# Patient Record
Sex: Female | Born: 1935 | Race: Black or African American | Hispanic: No | Smoking: Never smoker
Health system: Southern US, Community
[De-identification: ages and names within clinical notes are randomized; demographics above are authoritative.]

## PROBLEM LIST (undated history)

## (undated) DIAGNOSIS — E119 Type 2 diabetes mellitus without complications: Secondary | ICD-10-CM

## (undated) DIAGNOSIS — G8190 Hemiplegia, unspecified affecting unspecified side: Secondary | ICD-10-CM

## (undated) DIAGNOSIS — F32A Depression, unspecified: Secondary | ICD-10-CM

## (undated) DIAGNOSIS — F329 Major depressive disorder, single episode, unspecified: Secondary | ICD-10-CM

## (undated) HISTORY — DX: Type 2 diabetes mellitus without complications: E11.9

## (undated) HISTORY — DX: Major depressive disorder, single episode, unspecified: F32.9

## (undated) HISTORY — DX: Depression, unspecified: F32.A

## (undated) HISTORY — DX: Hemiplegia, unspecified affecting unspecified side: G81.90

---

## 2003-11-13 ENCOUNTER — Ambulatory Visit (HOSPITAL_COMMUNITY): Admission: RE | Admit: 2003-11-13 | Discharge: 2003-11-13 | Payer: Self-pay

## 2015-08-04 ENCOUNTER — Ambulatory Visit (INDEPENDENT_AMBULATORY_CARE_PROVIDER_SITE_OTHER): Payer: Medicare Other | Admitting: Sports Medicine

## 2015-08-04 ENCOUNTER — Ambulatory Visit: Payer: Self-pay

## 2015-08-04 ENCOUNTER — Encounter: Payer: Self-pay | Admitting: Sports Medicine

## 2015-08-04 VITALS — BP 173/87 | HR 68 | Resp 16

## 2015-08-04 DIAGNOSIS — M722 Plantar fascial fibromatosis: Secondary | ICD-10-CM

## 2015-08-04 DIAGNOSIS — R531 Weakness: Secondary | ICD-10-CM

## 2015-08-04 DIAGNOSIS — B351 Tinea unguium: Secondary | ICD-10-CM | POA: Diagnosis not present

## 2015-08-04 DIAGNOSIS — M79676 Pain in unspecified toe(s): Secondary | ICD-10-CM | POA: Diagnosis not present

## 2015-08-04 NOTE — Progress Notes (Signed)
Patient ID: Sara Schmidt, female   DOB: 11-16-1930, 82 y.o.   MRN: [id number] Subjective: Sara Schmidt is a 82 y.o. female patient seen today in office with complaint of painful thickened and elongated toenails; unable to trim. Patient denies history of known Diabetes, Neuropathy, or Vascular disease. Admits to history of stroke with Left sided weakness. Patient has no other pedal complaints at this time.   Review of Systems  Musculoskeletal: Positive for arthralgias.  All other systems reviewed and are negative.   There are no active problems to display for this patient.   No current outpatient prescriptions on file prior to visit.   No current facility-administered medications on file prior to visit.    Allergies  Allergen Reactions  . Ampicillin     Objective: Physical Exam  General: Well developed, nourished, no acute distress, awake, alert and oriented x 3  Vascular: Dorsalis pedis artery 1/4 bilateral, Posterior tibial artery 0/4 bilateral, skin temperature warm to warm proximal to distal bilateral lower extremities, no varicosities, decreased pedal hair present bilateral.  Neurological: Gross sensation present via light touch bilateral.   Dermatological: Skin is warm, dry, and supple bilateral, Nails 1-10 are tender, long, thick, and discolored with mild subungal debris with spicules at hallux nail, no webspace macerations present bilateral, no open lesions present bilateral, no callus/corns/hyperkeratotic tissue present bilateral. No signs of infection bilateral.  Musculoskeletal: Hammertoe deformities noted bilateral. Muscular strength decreased with left sided weakness without painon range of motion. No pain with calf compression bilateral.  Assessment and Plan:  Problem List Items Addressed This Visit    None    Visit Diagnoses    Pain due to onychomycosis of toenail    -  Primary    Left-sided weakness           -Examined patient.  -Discussed treatment options  for painful mycotic nails. -Mechanically debrided and reduced mycotic nails with sterile nail nipper and dremel nail file without incident. -Patient to return in 3 months for follow up evaluation or sooner if symptoms worsen.  Asencion Islamitorya Eri Platten, DPM

## 2015-08-04 NOTE — Progress Notes (Deleted)
   Subjective:    Patient ID: Sara Schmidt, female    DOB: Aug 26, 1930, 82 y.o.   MRN: [id number]  HPI    Review of Systems  Musculoskeletal: Positive for arthralgias.  All other systems reviewed and are negative.      Objective:   Physical Exam        Assessment & Plan:

## 2015-11-10 ENCOUNTER — Encounter: Payer: Self-pay | Admitting: Sports Medicine

## 2015-11-10 ENCOUNTER — Ambulatory Visit (INDEPENDENT_AMBULATORY_CARE_PROVIDER_SITE_OTHER): Payer: Medicare Other | Admitting: Sports Medicine

## 2015-11-10 DIAGNOSIS — M6289 Other specified disorders of muscle: Secondary | ICD-10-CM

## 2015-11-10 DIAGNOSIS — B351 Tinea unguium: Secondary | ICD-10-CM

## 2015-11-10 DIAGNOSIS — R531 Weakness: Secondary | ICD-10-CM

## 2015-11-10 DIAGNOSIS — M79676 Pain in unspecified toe(s): Secondary | ICD-10-CM | POA: Diagnosis not present

## 2015-11-10 DIAGNOSIS — E1142 Type 2 diabetes mellitus with diabetic polyneuropathy: Secondary | ICD-10-CM

## 2015-11-10 NOTE — Progress Notes (Signed)
Patient ID: Sara Schmidt Shands, female   DOB: May 15, 1930, 82 y.o.   MRN: [id number]  Subjective: Sara Schmidt Marinello is a 82 y.o. Diabetic female patient seen today in office with complaint of painful thickened and elongated toenails; unable to trim. Patient denies any changes since last visit is assisted by living facility care giver. Patient has no other pedal complaints at this time.   FBS not recorded   There are no active problems to display for this patient.   Current Outpatient Prescriptions on File Prior to Visit  Medication Sig Dispense Refill  . acetaminophen (TYLENOL) 650 MG CR tablet Take 650 mg by mouth every 8 (eight) hours as needed for pain.    . Albuterol (VENTOLIN IN) Inhale into the lungs.    Marland Kitchen. amLODipine (NORVASC) 10 MG tablet Take 10 mg by mouth daily.    Marland Kitchen. aspirin 81 MG EC tablet Take 81 mg by mouth daily. Swallow whole.    Marland Kitchen. atorvastatin (LIPITOR) 10 MG tablet Take 10 mg by mouth daily.    . CHOLECALCIFEROL PO Take by mouth.    . cloNIDine (CATAPRES) 0.1 MG tablet Take 0.1 mg by mouth 2 (two) times daily.    Tery Sanfilippo. Docusate Sodium (COLACE PO) Take by mouth.    . DULoxetine (CYMBALTA) 60 MG capsule Take 60 mg by mouth daily.    . Ferrous Sulfate (FEROSUL PO) Take by mouth.    . furosemide (LASIX) 20 MG tablet Take 20 mg by mouth.    . gabapentin (NEURONTIN) 300 MG capsule Take 300 mg by mouth 3 (three) times daily.    . Hypromellose (ARTIFICIAL TEARS OP) Apply to eye.    . insulin glargine (LANTUS) 100 UNIT/ML injection Inject into the skin at bedtime.    Marland Kitchen. lisinopril (PRINIVIL,ZESTRIL) 40 MG tablet Take 40 mg by mouth daily.    . magnesium oxide (MAG-OX) 400 MG tablet Take 400 mg by mouth daily.    . metFORMIN (GLUCOPHAGE) 1000 MG tablet Take 1,000 mg by mouth 2 (two) times daily with a meal.    . metoprolol succinate (TOPROL-XL) 50 MG 24 hr tablet Take 50 mg by mouth daily. Take with or immediately following a meal.    . omeprazole (PRILOSEC) 20 MG capsule Take 20 mg by mouth daily.     . polyethylene glycol powder (MIRALAX) powder Take 1 Container by mouth once.    . pregabalin (LYRICA) 50 MG capsule Take 50 mg by mouth 3 (three) times daily.    Marland Kitchen. senna (SENOKOT) 8.6 MG tablet Take 1 tablet by mouth daily.     No current facility-administered medications on file prior to visit.    Allergies  Allergen Reactions  . Ampicillin     Objective: Physical Exam  General: Well developed, nourished, no acute distress, awake, alert and oriented x 3 in wheelchair  Vascular: Dorsalis pedis artery 1/4 bilateral, Posterior tibial artery 0/4 bilateral, skin temperature warm to warm proximal to distal bilateral lower extremities, no varicosities, decreased pedal hair present bilateral.  Neurological: Gross sensation present via light touch bilateral.   Dermatological: Skin is warm, dry, and supple bilateral, Nails 1-10 are tender, long, thick, and discolored with mild subungal debris with spicules at hallux nail, no webspace macerations present bilateral, no open lesions present bilateral, no callus/corns/hyperkeratotic tissue present bilateral. No signs of infection bilateral.  Musculoskeletal: Hammertoe deformities noted bilateral. Muscular strength decreased with left sided weakness without painon range of motion. No pain with calf compression bilateral.  Assessment and Plan:  Problem List Items Addressed This Visit    None    Visit Diagnoses    Pain due to onychomycosis of toenail    -  Primary    Left-sided weakness        Diabetic peripheral neuropathy associated with type 2 diabetes mellitus (HCC)          -Examined patient.  -Discussed treatment options for painful mycotic nails. -Mechanically debrided and reduced mycotic nails with sterile nail nipper and dremel nail file without incident. -Encouraged daily foot inspection in the setting of diabetes  -Patient to return in 3 months for follow up evaluation or sooner if symptoms worsen.  Asencion Islam, DPM

## 2015-11-17 ENCOUNTER — Emergency Department (HOSPITAL_COMMUNITY): Payer: Medicare Other

## 2015-11-17 ENCOUNTER — Inpatient Hospital Stay (HOSPITAL_COMMUNITY)
Admission: EM | Admit: 2015-11-17 | Discharge: 2015-11-22 | DRG: 190 | Disposition: A | Discharge status: Skilled Nursing Facility | Payer: Medicare Other | Attending: Internal Medicine | Admitting: Internal Medicine

## 2015-11-17 ENCOUNTER — Encounter (HOSPITAL_COMMUNITY): Payer: Self-pay | Admitting: Emergency Medicine

## 2015-11-17 DIAGNOSIS — J440 Chronic obstructive pulmonary disease with acute lower respiratory infection: Secondary | ICD-10-CM | POA: Diagnosis present

## 2015-11-17 DIAGNOSIS — E1140 Type 2 diabetes mellitus with diabetic neuropathy, unspecified: Secondary | ICD-10-CM | POA: Diagnosis present

## 2015-11-17 DIAGNOSIS — I248 Other forms of acute ischemic heart disease: Secondary | ICD-10-CM | POA: Diagnosis present

## 2015-11-17 DIAGNOSIS — G9341 Metabolic encephalopathy: Secondary | ICD-10-CM | POA: Diagnosis present

## 2015-11-17 DIAGNOSIS — Z9989 Dependence on other enabling machines and devices: Secondary | ICD-10-CM

## 2015-11-17 DIAGNOSIS — R0600 Dyspnea, unspecified: Secondary | ICD-10-CM

## 2015-11-17 DIAGNOSIS — R1310 Dysphagia, unspecified: Secondary | ICD-10-CM | POA: Diagnosis present

## 2015-11-17 DIAGNOSIS — Y95 Nosocomial condition: Secondary | ICD-10-CM | POA: Diagnosis present

## 2015-11-17 DIAGNOSIS — J441 Chronic obstructive pulmonary disease with (acute) exacerbation: Secondary | ICD-10-CM | POA: Diagnosis present

## 2015-11-17 DIAGNOSIS — F329 Major depressive disorder, single episode, unspecified: Secondary | ICD-10-CM | POA: Diagnosis present

## 2015-11-17 DIAGNOSIS — I110 Hypertensive heart disease with heart failure: Secondary | ICD-10-CM | POA: Diagnosis present

## 2015-11-17 DIAGNOSIS — Z880 Allergy status to penicillin: Secondary | ICD-10-CM

## 2015-11-17 DIAGNOSIS — E785 Hyperlipidemia, unspecified: Secondary | ICD-10-CM | POA: Diagnosis present

## 2015-11-17 DIAGNOSIS — J9622 Acute and chronic respiratory failure with hypercapnia: Secondary | ICD-10-CM | POA: Diagnosis present

## 2015-11-17 DIAGNOSIS — Z833 Family history of diabetes mellitus: Secondary | ICD-10-CM

## 2015-11-17 DIAGNOSIS — R0902 Hypoxemia: Secondary | ICD-10-CM

## 2015-11-17 DIAGNOSIS — Z6841 Body Mass Index (BMI) 40.0 and over, adult: Secondary | ICD-10-CM

## 2015-11-17 DIAGNOSIS — E662 Morbid (severe) obesity with alveolar hypoventilation: Secondary | ICD-10-CM | POA: Diagnosis present

## 2015-11-17 DIAGNOSIS — G4733 Obstructive sleep apnea (adult) (pediatric): Secondary | ICD-10-CM | POA: Diagnosis not present

## 2015-11-17 DIAGNOSIS — I10 Essential (primary) hypertension: Secondary | ICD-10-CM | POA: Diagnosis not present

## 2015-11-17 DIAGNOSIS — J189 Pneumonia, unspecified organism: Secondary | ICD-10-CM | POA: Diagnosis present

## 2015-11-17 DIAGNOSIS — R32 Unspecified urinary incontinence: Secondary | ICD-10-CM | POA: Diagnosis present

## 2015-11-17 DIAGNOSIS — I471 Supraventricular tachycardia: Secondary | ICD-10-CM | POA: Diagnosis present

## 2015-11-17 DIAGNOSIS — I69354 Hemiplegia and hemiparesis following cerebral infarction affecting left non-dominant side: Secondary | ICD-10-CM | POA: Diagnosis not present

## 2015-11-17 DIAGNOSIS — I4510 Unspecified right bundle-branch block: Secondary | ICD-10-CM | POA: Diagnosis present

## 2015-11-17 DIAGNOSIS — E119 Type 2 diabetes mellitus without complications: Secondary | ICD-10-CM

## 2015-11-17 DIAGNOSIS — R4781 Slurred speech: Secondary | ICD-10-CM | POA: Diagnosis present

## 2015-11-17 DIAGNOSIS — I69398 Other sequelae of cerebral infarction: Secondary | ICD-10-CM | POA: Diagnosis not present

## 2015-11-17 DIAGNOSIS — R0602 Shortness of breath: Secondary | ICD-10-CM | POA: Diagnosis present

## 2015-11-17 DIAGNOSIS — Z794 Long term (current) use of insulin: Secondary | ICD-10-CM | POA: Diagnosis not present

## 2015-11-17 DIAGNOSIS — G8929 Other chronic pain: Secondary | ICD-10-CM | POA: Diagnosis present

## 2015-11-17 DIAGNOSIS — J9621 Acute and chronic respiratory failure with hypoxia: Secondary | ICD-10-CM | POA: Diagnosis not present

## 2015-11-17 DIAGNOSIS — E872 Acidosis: Secondary | ICD-10-CM | POA: Diagnosis present

## 2015-11-17 DIAGNOSIS — J9690 Respiratory failure, unspecified, unspecified whether with hypoxia or hypercapnia: Secondary | ICD-10-CM

## 2015-11-17 DIAGNOSIS — E860 Dehydration: Secondary | ICD-10-CM | POA: Diagnosis present

## 2015-11-17 DIAGNOSIS — I5033 Acute on chronic diastolic (congestive) heart failure: Secondary | ICD-10-CM | POA: Diagnosis present

## 2015-11-17 DIAGNOSIS — J9601 Acute respiratory failure with hypoxia: Secondary | ICD-10-CM | POA: Diagnosis not present

## 2015-11-17 LAB — BASIC METABOLIC PANEL
Anion gap: 8 (ref 5–15)
BUN: 15 mg/dL (ref 6–20)
CALCIUM: 9.2 mg/dL (ref 8.9–10.3)
CO2: 32 mmol/L (ref 22–32)
CREATININE: 0.73 mg/dL (ref 0.44–1.00)
Chloride: 101 mmol/L (ref 101–111)
GFR calc Af Amer: >60 mL/min (ref >60)
GLUCOSE: 146 mg/dL — AB (ref 65–99)
POTASSIUM: 4.2 mmol/L (ref 3.5–5.1)
SODIUM: 141 mmol/L (ref 135–145)

## 2015-11-17 LAB — CBC WITH DIFFERENTIAL/PLATELET
Basophils Absolute: 0 10*3/uL (ref 0.0–0.1)
Basophils Relative: 0 %
EOS ABS: 0.2 10*3/uL (ref 0.0–0.7)
EOS PCT: 2 %
HCT: 40 % (ref 36.0–46.0)
Hemoglobin: 11.7 g/dL — ABNORMAL LOW (ref 12.0–15.0)
LYMPHS ABS: 1.7 10*3/uL (ref 0.7–4.0)
LYMPHS PCT: 23 %
MCH: 26.2 pg (ref 26.0–34.0)
MCHC: 29.3 g/dL — AB (ref 30.0–36.0)
MCV: 89.5 fL (ref 78.0–100.0)
MONO ABS: 0.6 10*3/uL (ref 0.1–1.0)
MONOS PCT: 8 %
Neutro Abs: 5 10*3/uL (ref 1.7–7.7)
Neutrophils Relative %: 67 %
PLATELETS: 220 10*3/uL (ref 150–400)
RBC: 4.47 MIL/uL (ref 3.87–5.11)
RDW: 16.8 % — AB (ref 11.5–15.5)
WBC: 7.4 10*3/uL (ref 4.0–10.5)

## 2015-11-17 LAB — LACTIC ACID, PLASMA
LACTIC ACID, VENOUS: 3.1 mmol/L — AB (ref 0.5–1.9)
Lactic Acid, Venous: 2.3 mmol/L (ref 0.5–1.9)

## 2015-11-17 LAB — BRAIN NATRIURETIC PEPTIDE: B Natriuretic Peptide: 130 pg/mL — ABNORMAL HIGH (ref 0.0–100.0)

## 2015-11-17 MED ORDER — PANTOPRAZOLE SODIUM 40 MG PO TBEC
40.0000 mg | DELAYED_RELEASE_TABLET | Freq: Every day | ORAL | Status: DC
  Administered 2015-11-18 – 2015-11-19 (×2): 40 mg via ORAL
  Filled 2015-11-17 (×2): qty 1

## 2015-11-17 MED ORDER — DULOXETINE HCL 30 MG PO CPEP
60.0000 mg | ORAL_CAPSULE | Freq: Every day | ORAL | Status: DC
  Administered 2015-11-18 – 2015-11-19 (×2): 60 mg via ORAL
  Filled 2015-11-17 (×2): qty 1

## 2015-11-17 MED ORDER — DEXTROSE 5 % IV SOLN
1.0000 g | Freq: Three times a day (TID) | INTRAVENOUS | Status: DC
  Administered 2015-11-18 – 2015-11-21 (×10): 1 g via INTRAVENOUS
  Filled 2015-11-17 (×12): qty 1

## 2015-11-17 MED ORDER — METOPROLOL SUCCINATE ER 25 MG PO TB24
50.0000 mg | ORAL_TABLET | Freq: Every day | ORAL | Status: DC
  Administered 2015-11-18: 50 mg via ORAL
  Filled 2015-11-17: qty 1

## 2015-11-17 MED ORDER — AMLODIPINE BESYLATE 10 MG PO TABS
10.0000 mg | ORAL_TABLET | Freq: Every day | ORAL | Status: DC
  Administered 2015-11-18 – 2015-11-19 (×2): 10 mg via ORAL
  Filled 2015-11-17 (×2): qty 1

## 2015-11-17 MED ORDER — ATORVASTATIN CALCIUM 10 MG PO TABS
10.0000 mg | ORAL_TABLET | Freq: Every day | ORAL | Status: DC
  Administered 2015-11-18 (×2): 10 mg via ORAL
  Filled 2015-11-17 (×2): qty 1

## 2015-11-17 MED ORDER — LISINOPRIL 10 MG PO TABS
40.0000 mg | ORAL_TABLET | Freq: Every day | ORAL | Status: DC
  Administered 2015-11-18 – 2015-11-19 (×2): 40 mg via ORAL
  Filled 2015-11-17 (×2): qty 2

## 2015-11-17 MED ORDER — CLONIDINE HCL 0.1 MG PO TABS
0.1000 mg | ORAL_TABLET | Freq: Two times a day (BID) | ORAL | Status: DC
  Administered 2015-11-18 – 2015-11-19 (×4): 0.1 mg via ORAL
  Filled 2015-11-17 (×4): qty 1

## 2015-11-17 MED ORDER — ONDANSETRON HCL 4 MG PO TABS: 4.0000 mg | ORAL_TABLET | Freq: Four times a day (QID) | ORAL | Status: DC | PRN

## 2015-11-17 MED ORDER — INSULIN ASPART 100 UNIT/ML ~~LOC~~ SOLN: 0.0000 [IU] | Freq: Three times a day (TID) | SUBCUTANEOUS | Status: DC

## 2015-11-17 MED ORDER — PREGABALIN 50 MG PO CAPS
50.0000 mg | ORAL_CAPSULE | Freq: Every day | ORAL | Status: DC
  Administered 2015-11-18 – 2015-11-19 (×2): 50 mg via ORAL
  Filled 2015-11-17 (×2): qty 1

## 2015-11-17 MED ORDER — CETYLPYRIDINIUM CHLORIDE 0.05 % MT LIQD
7.0000 mL | Freq: Two times a day (BID) | OROMUCOSAL | Status: DC
  Administered 2015-11-18 – 2015-11-22 (×8): 7 mL via OROMUCOSAL

## 2015-11-17 MED ORDER — METOPROLOL SUCCINATE ER 25 MG PO TB24
100.0000 mg | ORAL_TABLET | Freq: Every day | ORAL | Status: DC
  Administered 2015-11-18 – 2015-11-19 (×2): 100 mg via ORAL
  Filled 2015-11-17 (×2): qty 2

## 2015-11-17 MED ORDER — CHLORHEXIDINE GLUCONATE 0.12 % MT SOLN
15.0000 mL | Freq: Two times a day (BID) | OROMUCOSAL | Status: DC
Start: 2015-11-18 — End: 2015-11-22
  Administered 2015-11-18 – 2015-11-22 (×8): 15 mL via OROMUCOSAL
  Filled 2015-11-17 (×9): qty 15

## 2015-11-17 MED ORDER — SODIUM CHLORIDE 0.9 % IV BOLUS (SEPSIS)
1000.0000 mL | Freq: Once | INTRAVENOUS | Status: AC
  Administered 2015-11-17: 1000 mL via INTRAVENOUS

## 2015-11-17 MED ORDER — ENOXAPARIN SODIUM 40 MG/0.4ML ~~LOC~~ SOLN
40.0000 mg | Freq: Every day | SUBCUTANEOUS | Status: DC
Start: 2015-11-17 — End: 2015-11-22
  Administered 2015-11-18 – 2015-11-20 (×4): 40 mg via SUBCUTANEOUS
  Filled 2015-11-17 (×4): qty 0.4

## 2015-11-17 MED ORDER — ENSURE ENLIVE PO LIQD
237.0000 mL | Freq: Two times a day (BID) | ORAL | Status: DC
  Administered 2015-11-18 – 2015-11-19 (×3): 237 mL via ORAL

## 2015-11-17 MED ORDER — INSULIN GLARGINE 100 UNIT/ML ~~LOC~~ SOLN
30.0000 [IU] | Freq: Two times a day (BID) | SUBCUTANEOUS | Status: DC
  Administered 2015-11-18 – 2015-11-19 (×4): 30 [IU] via SUBCUTANEOUS
  Filled 2015-11-17 (×5): qty 0.3

## 2015-11-17 MED ORDER — METHYLPREDNISOLONE SODIUM SUCC 125 MG IJ SOLR
125.0000 mg | Freq: Once | INTRAMUSCULAR | Status: AC
  Administered 2015-11-17: 125 mg via INTRAVENOUS
  Filled 2015-11-17: qty 2

## 2015-11-17 MED ORDER — GABAPENTIN 300 MG PO CAPS
300.0000 mg | ORAL_CAPSULE | Freq: Three times a day (TID) | ORAL | Status: DC
  Administered 2015-11-18 – 2015-11-19 (×5): 300 mg via ORAL
  Filled 2015-11-17 (×5): qty 1

## 2015-11-17 MED ORDER — SENNA 8.6 MG PO TABS
2.0000 | ORAL_TABLET | Freq: Every day | ORAL | Status: DC
  Administered 2015-11-18 – 2015-11-20 (×3): 17.2 mg via ORAL
  Filled 2015-11-17 (×4): qty 2

## 2015-11-17 MED ORDER — ASPIRIN EC 81 MG PO TBEC
81.0000 mg | DELAYED_RELEASE_TABLET | Freq: Every day | ORAL | Status: DC
  Administered 2015-11-18 – 2015-11-19 (×2): 81 mg via ORAL
  Filled 2015-11-17 (×2): qty 1

## 2015-11-17 MED ORDER — VITAMIN D3 25 MCG (1000 UNIT) PO TABS
2000.0000 [IU] | ORAL_TABLET | Freq: Every day | ORAL | Status: DC
  Administered 2015-11-18 – 2015-11-19 (×2): 2000 [IU] via ORAL
  Filled 2015-11-17 (×2): qty 2

## 2015-11-17 MED ORDER — ONDANSETRON HCL 4 MG/2ML IJ SOLN: 4.0000 mg | Freq: Four times a day (QID) | INTRAMUSCULAR | Status: DC | PRN

## 2015-11-17 MED ORDER — ACETAMINOPHEN 325 MG PO TABS
650.0000 mg | ORAL_TABLET | Freq: Four times a day (QID) | ORAL | Status: DC | PRN
Start: 2015-11-17 — End: 2015-11-22

## 2015-11-17 MED ORDER — POLYVINYL ALCOHOL 1.4 % OP SOLN
1.0000 [drp] | Freq: Two times a day (BID) | OPHTHALMIC | Status: DC
  Administered 2015-11-18 – 2015-11-22 (×10): 1 [drp] via OPHTHALMIC
  Filled 2015-11-17: qty 15

## 2015-11-17 MED ORDER — DOCUSATE SODIUM 100 MG PO CAPS
100.0000 mg | ORAL_CAPSULE | Freq: Two times a day (BID) | ORAL | Status: DC
  Administered 2015-11-18 – 2015-11-19 (×4): 100 mg via ORAL
  Filled 2015-11-17 (×4): qty 1

## 2015-11-17 MED ORDER — ACETAMINOPHEN 650 MG RE SUPP: 650.0000 mg | Freq: Four times a day (QID) | RECTAL | Status: DC | PRN

## 2015-11-17 MED ORDER — DEXTROSE 5 % IV SOLN
2.0000 g | INTRAVENOUS | Status: AC
  Administered 2015-11-17: 2 g via INTRAVENOUS
  Filled 2015-11-17: qty 2

## 2015-11-17 MED ORDER — MAGNESIUM OXIDE 400 (241.3 MG) MG PO TABS
400.0000 mg | ORAL_TABLET | Freq: Every day | ORAL | Status: DC
  Administered 2015-11-18 – 2015-11-19 (×2): 400 mg via ORAL
  Filled 2015-11-17 (×2): qty 1

## 2015-11-17 MED ORDER — FERROUS SULFATE 325 (65 FE) MG PO TABS
325.0000 mg | ORAL_TABLET | Freq: Every day | ORAL | Status: DC
  Administered 2015-11-18 – 2015-11-19 (×2): 325 mg via ORAL
  Filled 2015-11-17 (×2): qty 1

## 2015-11-17 MED ORDER — IPRATROPIUM-ALBUTEROL 0.5-2.5 (3) MG/3ML IN SOLN
3.0000 mL | Freq: Once | RESPIRATORY_TRACT | Status: AC
  Administered 2015-11-17: 3 mL via RESPIRATORY_TRACT
  Filled 2015-11-17: qty 3

## 2015-11-17 MED ORDER — DEXTROSE 5 % IV SOLN: 2.0000 g | Freq: Once | INTRAVENOUS | Status: DC

## 2015-11-17 MED ORDER — POLYETHYLENE GLYCOL 3350 17 G PO PACK
17.0000 g | PACK | Freq: Every day | ORAL | Status: DC
  Administered 2015-11-18: 17 g via ORAL
  Filled 2015-11-17: qty 1

## 2015-11-17 MED ORDER — SODIUM CHLORIDE 0.9 % IV SOLN
INTRAVENOUS | Status: DC
  Administered 2015-11-17: 21:00:00 via INTRAVENOUS

## 2015-11-17 MED ORDER — VANCOMYCIN HCL 10 G IV SOLR
2000.0000 mg | INTRAVENOUS | Status: AC
  Administered 2015-11-17: 2000 mg via INTRAVENOUS
  Filled 2015-11-17: qty 2000

## 2015-11-17 MED ORDER — VANCOMYCIN HCL 10 G IV SOLR
1250.0000 mg | INTRAVENOUS | Status: DC
  Administered 2015-11-18: 1250 mg via INTRAVENOUS
  Filled 2015-11-17: qty 1250

## 2015-11-17 NOTE — ED Notes (Signed)
RN verified with lab that they can add on BNP to existing blood in lab.

## 2015-11-17 NOTE — ED Notes (Signed)
Patient transported to X-ray 

## 2015-11-17 NOTE — Progress Notes (Signed)
Pharmacy Antibiotic Note  Sara Schmidt is a 83 y.o. female admitted on 11/17/2015 with SOB and worsening dyspnea.  Pharmacy has been consulted for Vancomycin and Aztreonam dosing for possible PNA vs bronchitis. Noted allergy to ampicillin (SOB).  CrCl ~48 normalized (using rounded SCr 1)  Plan: Vancomycin 2g IV x1, then 1250mg  IV q24h Aztreonam 2g IV x1, then 1g IV q8h F/u renal function, VT at Css, cultures, clinical course   Temp (24hrs), Avg:98.5 F (36.9 C), Min:98.5 F (36.9 C), Max:98.5 F (36.9 C)  No results for input(s): WBC, CREATININE, LATICACIDVEN, VANCOTROUGH, VANCOPEAK, VANCORANDOM, GENTTROUGH, GENTPEAK, GENTRANDOM, TOBRATROUGH, TOBRAPEAK, TOBRARND, AMIKACINPEAK, AMIKACINTROU, AMIKACIN in the last 168 hours.  CrCl cannot be calculated (Unknown ideal weight.).    Allergies  Allergen Reactions  . Ampicillin Other (See Comments)    Reaction:  Unknown  Has patient had a PCN reaction causing immediate rash, facial/tongue/throat swelling, SOB or lightheadedness with hypotension:  Unsure Has patient had a PCN reaction causing severe rash involving mucus membranes or skin necrosis: Unsure Has patient had a PCN reaction that required hospitalization Unsure Has patient had a PCN reaction occurring within the last 10 years: Unsure If all of the above answers are "NO", then may proceed with Cephalosporin use.    Antimicrobials this admission: 7/24 Vancomycin >>  7/24 Aztreonam >>   Dose adjustments this admission:   Microbiology results: 7/24 BCx: ordered  Thank you for allowing pharmacy to be a part of this patient's care.  Haynes Hoehn, PharmD, BCPS 11/17/2015, 6:22 PM  Pager: [phone]

## 2015-11-17 NOTE — H&P (Signed)
History and Physical    Sara Schmidt ZOX:[id number] DOB: 02-Jun-1930 DOA: 11/17/2015  PCP: Katy Apo, MD  Patient coming from: Nursing home.  Chief Complaint: Shortness of breath.  HPI: Sara Schmidt is a 82 y.o. female with stroke with left-sided hemiparesis, sleep apnea, hypertension, diabetes mellitus, hyperlipidemia was brought to the ER after patient was having shortness of breath and productive cough. Patient has been having shortness of breath gradually worsening over the last 1 week. Patient also has been having productive cough with greenish phlegm. Chest pain is pleuritic in nature left-sided only happens when coughing.. In the ER chest x-ray showing possible infiltrates and lactate levels were elevated. Patient was given fluid bolus and empiric antibiotics for pneumonia and admitted for healthcare associated pneumonia versus aspiration. On my exam patient is not in distress. Patient does not look septic.  ED Course: Blood cultures were obtained and started on empiric antibiotics.  Review of Systems: As per HPI, rest all negative.   Past Medical History:  Diagnosis Date  . Depression   . Diabetes (HCC)   . Hemiplegia (HCC)     History reviewed. No pertinent surgical history.   reports that she has never smoked. She has never used smokeless tobacco. She reports that she does not drink alcohol or use drugs.  Allergies  Allergen Reactions  . Ampicillin Shortness Of Breath    Reaction:  Unknown  Has patient had a PCN reaction causing immediate rash, facial/tongue/throat swelling, SOB or lightheadedness with hypotension:  Unsure Has patient had a PCN reaction causing severe rash involving mucus membranes or skin necrosis: Unsure Has patient had a PCN reaction that required hospitalization Unsure Has patient had a PCN reaction occurring within the last 10 years: Unsure If all of the above answers are "NO", then may proceed with Cephalosporin use.    Family History    Problem Relation Age of Onset  . Diabetes Mellitus II Mother     Prior to Admission medications   Medication Sig Start Date End Date Taking? Authorizing Provider  acetaminophen (TYLENOL) 650 MG CR tablet Take 650 mg by mouth every 12 (twelve) hours.    Yes Historical Provider, MD  amLODipine (NORVASC) 10 MG tablet Take 10 mg by mouth daily.   Yes Historical Provider, MD  aspirin EC 81 MG tablet Take 81 mg by mouth daily.   Yes Historical Provider, MD  atorvastatin (LIPITOR) 10 MG tablet Take 10 mg by mouth at bedtime.    Yes Historical Provider, MD  Carboxymethylcellul-Glycerin (REFRESH OPTIVE) 1-0.9 % GEL Place 1 drop into both eyes 2 (two) times daily.   Yes Historical Provider, MD  cholecalciferol (VITAMIN D) 1000 units tablet Take 2,000 Units by mouth daily.   Yes Historical Provider, MD  cloNIDine (CATAPRES) 0.1 MG tablet Take 0.1 mg by mouth every 12 (twelve) hours.    Yes Historical Provider, MD  docusate sodium (COLACE) 100 MG capsule Take 100 mg by mouth 2 (two) times daily.   Yes Historical Provider, MD  DULoxetine (CYMBALTA) 60 MG capsule Take 60 mg by mouth daily.   Yes Historical Provider, MD  ferrous sulfate 325 (65 FE) MG tablet Take 325 mg by mouth daily with breakfast.   Yes Historical Provider, MD  furosemide (LASIX) 20 MG tablet Take 20 mg by mouth daily.    Yes Historical Provider, MD  gabapentin (NEURONTIN) 300 MG capsule Take 300 mg by mouth 3 (three) times daily.   Yes Historical Provider, MD  insulin glargine (LANTUS) 100  UNIT/ML injection Inject 30 Units into the skin every 12 (twelve) hours.    Yes Historical Provider, MD  lisinopril (PRINIVIL,ZESTRIL) 40 MG tablet Take 40 mg by mouth daily.   Yes Historical Provider, MD  magnesium oxide (MAG-OX) 400 (241.3 Mg) MG tablet Take 400 mg by mouth daily.   Yes Historical Provider, MD  metFORMIN (GLUCOPHAGE) 1000 MG tablet Take 1,000 mg by mouth 2 (two) times daily with a meal.   Yes Historical Provider, MD  metoprolol  succinate (TOPROL-XL) 50 MG 24 hr tablet Take 50-100 mg by mouth 2 (two) times daily. Pt takes two tablets in the morning and one tablet at bedtime.   Yes Historical Provider, MD  omeprazole (PRILOSEC) 20 MG capsule Take 20 mg by mouth daily before breakfast.    Yes Historical Provider, MD  polyethylene glycol (MIRALAX / GLYCOLAX) packet Take 17 g by mouth at bedtime.   Yes Historical Provider, MD  pregabalin (LYRICA) 50 MG capsule Take 50 mg by mouth daily.    Yes Historical Provider, MD  senna (SENOKOT) 8.6 MG tablet Take 2 tablets by mouth at bedtime.    Yes Historical Provider, MD    Physical Exam: Vitals:   11/17/15 1900 11/17/15 1930 11/17/15 2030 11/17/15 2215  BP: 148/78 149/76 153/82 (!) 143/59  Pulse: 81 79 79 80  Resp: 25 26 26  (!) 28  Temp:    97.4 F (36.3 C)  TempSrc:    Oral  SpO2: 94% 95% 100% 94%  Weight:    240 lb 15.4 oz (109.3 kg)  Height:          Constitutional: Not in distress. Vitals:   11/17/15 1900 11/17/15 1930 11/17/15 2030 11/17/15 2215  BP: 148/78 149/76 153/82 (!) 143/59  Pulse: 81 79 79 80  Resp: 25 26 26  (!) 28  Temp:    97.4 F (36.3 C)  TempSrc:    Oral  SpO2: 94% 95% 100% 94%  Weight:    240 lb 15.4 oz (109.3 kg)  Height:       Eyes: Anicteric no pallor. ENMT: No discharge from the ears eyes nose or mouth. Neck: No mass felt. No JVD appreciated. Respiratory: No rhonchi or crepitations. Cardiovascular: S1 and S2 heard. Abdomen: Soft nontender bowel sounds present. No guarding or rigidity. Musculoskeletal: No edema. Skin: No rash. Neurologic: Alert awake oriented to time place and person. Left-sided weakness. Psychiatric: Appears normal.   Labs on Admission: I have personally reviewed following labs and imaging studies  CBC:  Recent Labs Lab 11/17/15 1828  WBC 7.4  NEUTROABS 5.0  HGB 11.7*  HCT 40.0  MCV 89.5  PLT 220   Basic Metabolic Panel:  Recent Labs Lab 11/17/15 1828  NA 141  K 4.2  CL 101  CO2 32  GLUCOSE  146*  BUN 15  CREATININE 0.73  CALCIUM 9.2   GFR: Estimated Creatinine Clearance: 65.5 mL/min (by C-G formula based on SCr of 0.8 mg/dL). Liver Function Tests: No results for input(s): AST, ALT, ALKPHOS, BILITOT, PROT, ALBUMIN in the last 168 hours. No results for input(s): LIPASE, AMYLASE in the last 168 hours. No results for input(s): AMMONIA in the last 168 hours. Coagulation Profile: No results for input(s): INR, PROTIME in the last 168 hours. Cardiac Enzymes: No results for input(s): CKTOTAL, CKMB, CKMBINDEX, TROPONINI in the last 168 hours. BNP (last 3 results) No results for input(s): PROBNP in the last 8760 hours. HbA1C: No results for input(s): HGBA1C in the last 72 hours. CBG:  No results for input(s): GLUCAP in the last 168 hours. Lipid Profile: No results for input(s): CHOL, HDL, LDLCALC, TRIG, CHOLHDL, LDLDIRECT in the last 72 hours. Thyroid Function Tests: No results for input(s): TSH, T4TOTAL, FREET4, T3FREE, THYROIDAB in the last 72 hours. Anemia Panel: No results for input(s): VITAMINB12, FOLATE, FERRITIN, TIBC, IRON, RETICCTPCT in the last 72 hours. Urine analysis: No results found for: COLORURINE, APPEARANCEUR, LABSPEC, PHURINE, GLUCOSEU, HGBUR, BILIRUBINUR, KETONESUR, PROTEINUR, UROBILINOGEN, NITRITE, LEUKOCYTESUR Sepsis Labs: @LABRCNTIP (procalcitonin:4,lacticidven:4) )No results found for this or any previous visit (from the past 240 hour(s)).   Radiological Exams on Admission: Dg Chest 2 View  Result Date: 11/17/2015 CLINICAL DATA:  Clinical suspicion for pneumonia. EXAM: CHEST  2 VIEW COMPARISON:  None. FINDINGS: The cardiac silhouette is enlarged. Mediastinal contours appear intact. Atherosclerotic calcifications of the aortic arch are noted. There is no evidence of pneumothorax. There is mixed alveolar and interstitial pattern pulmonary edema. More focal areas of airspace consolidation are seen in bilateral lower lobes. There are probably bilateral small to  moderate pleural effusions. Osseous structures are without acute abnormality. Soft tissues are grossly normal. IMPRESSION: Enlarged cardiac silhouette. Mixed interstitial and alveolar pulmonary edema. More focal areas of airspace disease in bilateral lower lobes may represent focal airspace consolidation versus areas of asymmetric alveolar edema. Probably bilateral pleural effusions. Electronically Signed   By: Ted Mcalpine M.D.   On: 11/17/2015 17:39   EKG: Independently reviewed. Normal sinus rhythm with RBBB.  Assessment/Plan Principal Problem:   HCAP (healthcare-associated pneumonia) Active Problems:   Essential hypertension   Diabetes mellitus type 2, controlled (HCC)   OSA on CPAP   Pneumonia    1. Healthcare associated pneumonia versus aspiration - patient is placed on vancomycin and aztreonam. Get swallow evaluation. Follow cultures and urine studies. Continue with gentle hydration and follow lactate levels and procalcitonin levels. Patient is not appearing septic. Since patient also has left-sided pleuritic chest pain we will cycle cardiac markers check d-dimer. 2. Hypertension - continue lisinopril, metoprolol, clonidine, Norvasc. Patient is also on as needed Lasix which will be held for now secondary to elevated lactate levels. Patient is probably on Lasix for CHF. 3. Diabetes mellitus type 2 on Lantus which will be continued along with sliding scale coverage. 4. History of stroke with left-sided weakness - on aspirin and statins. 5. OSA on CPAP. 6. Hyperlipidemia on statins.   DVT prophylaxis: Lovenox.  Code Status: Full code.  Family Communication: No family at the bedside.  Disposition Plan: Nursing home.  Consults called: None.  Admission status: Inpatient. Telemetry. Likely stay 2 days.    Eduard Clos MD [facility] Pager [phone].  If 7PM-7AM, please contact night-coverage [email or web address] Password [facility]  11/17/2015, 10:47 PM

## 2015-11-17 NOTE — ED Notes (Signed)
RN discussed fluid bolus with Dr. Erin Hearing.  Fluids hung to gravity.

## 2015-11-17 NOTE — ED Provider Notes (Signed)
[facility]-EMERGENCY DEPT Provider Note   CSN: [id number] Arrival date & time: 11/17/15  1622  First Provider Contact:  First MD Initiated Contact with Patient 11/17/15 1704        History   Chief Complaint Chief Complaint  Patient presents with  . Shortness of Breath    HPI Sara Schmidt is a 82 y.o. female.  Patient is not the best historian, she says that she has been short of breath for one week and may have had an aspiration event. EMS relays that the patient has been coughing for a couple days and progressively worsening dyspnea. Has been coughing up [place] phlegm. Lives in a nursing facility. Called EMS and the patient was 87% on her baseline 2 L however as above 90 when you increase her oxygen to 4 L. Otherwise all signs were normal with them they brought her here for further evaluation. On review of records patient has a history of diabetes, depression and hemiplegia. However on her records from the facility she has history of CHF, hypertension, and is on albuterol.      Past Medical History:  Diagnosis Date  . Depression   . Diabetes (HCC)   . Hemiplegia [facility])     Patient Active Problem List   Diagnosis Date Noted  . HCAP (healthcare-associated pneumonia) 11/17/2015  . Essential hypertension 11/17/2015  . Diabetes mellitus type 2, controlled (HCC) 11/17/2015  . OSA on CPAP 11/17/2015  . Pneumonia 11/17/2015    History reviewed. No pertinent surgical history.  OB History    No data available       Home Medications    Prior to Admission medications   Medication Sig Start Date End Date Taking? Authorizing Provider  acetaminophen (TYLENOL) 650 MG CR tablet Take 650 mg by mouth every 12 (twelve) hours.    Yes Historical Provider, MD  amLODipine (NORVASC) 10 MG tablet Take 10 mg by mouth daily.   Yes Historical Provider, MD  aspirin EC 81 MG tablet Take 81 mg by mouth daily.   Yes Historical Provider, MD  atorvastatin (LIPITOR) 10 MG tablet Take 10 mg by mouth at  bedtime.    Yes Historical Provider, MD  Carboxymethylcellul-Glycerin (REFRESH OPTIVE) 1-0.9 % GEL Place 1 drop into both eyes 2 (two) times daily.   Yes Historical Provider, MD  cholecalciferol (VITAMIN D) 1000 units tablet Take 2,000 Units by mouth daily.   Yes Historical Provider, MD  cloNIDine (CATAPRES) 0.1 MG tablet Take 0.1 mg by mouth every 12 (twelve) hours.    Yes Historical Provider, MD  docusate sodium (COLACE) 100 MG capsule Take 100 mg by mouth 2 (two) times daily.   Yes Historical Provider, MD  DULoxetine (CYMBALTA) 60 MG capsule Take 60 mg by mouth daily.   Yes Historical Provider, MD  ferrous sulfate 325 (65 FE) MG tablet Take 325 mg by mouth daily with breakfast.   Yes Historical Provider, MD  furosemide (LASIX) 20 MG tablet Take 20 mg by mouth daily.    Yes Historical Provider, MD  gabapentin (NEURONTIN) 300 MG capsule Take 300 mg by mouth 3 (three) times daily.   Yes Historical Provider, MD  insulin glargine (LANTUS) 100 UNIT/ML injection Inject 30 Units into the skin every 12 (twelve) hours.    Yes Historical Provider, MD  lisinopril (PRINIVIL,ZESTRIL) 40 MG tablet Take 40 mg by mouth daily.   Yes Historical Provider, MD  magnesium oxide (MAG-OX) 400 (241.3 Mg) MG tablet Take 400 mg by mouth daily.   Yes  Historical Provider, MD  metFORMIN (GLUCOPHAGE) 1000 MG tablet Take 1,000 mg by mouth 2 (two) times daily with a meal.   Yes Historical Provider, MD  metoprolol succinate (TOPROL-XL) 50 MG 24 hr tablet Take 50-100 mg by mouth 2 (two) times daily. Pt takes two tablets in the morning and one tablet at bedtime.   Yes Historical Provider, MD  omeprazole (PRILOSEC) 20 MG capsule Take 20 mg by mouth daily before breakfast.    Yes Historical Provider, MD  polyethylene glycol (MIRALAX / GLYCOLAX) packet Take 17 g by mouth at bedtime.   Yes Historical Provider, MD  pregabalin (LYRICA) 50 MG capsule Take 50 mg by mouth daily.    Yes Historical Provider, MD  senna (SENOKOT) 8.6 MG tablet  Take 2 tablets by mouth at bedtime.    Yes Historical Provider, MD    Family History Family History  Problem Relation Age of Onset  . Diabetes Mellitus II Mother     Social History Social History  Substance Use Topics  . Smoking status: Never Smoker  . Smokeless tobacco: Never Used  . Alcohol use No     Allergies   Ampicillin   Review of Systems Review of Systems  All other systems reviewed and are negative.    Physical Exam Updated Vital Signs BP (!) 143/59 (BP Location: Left Arm)   Pulse 80   Temp 97.4 F (36.3 C) (Oral)   Resp (!) 28   Ht 5\' 5"  (1.651 m)   Wt 240 lb 15.4 oz (109.3 kg)   SpO2 94%   BMI 40.10 kg/m   Physical Exam  Constitutional: She appears well-developed and well-nourished.  HENT:  Head: Normocephalic and atraumatic.  Neck: Normal range of motion.  Cardiovascular: Normal rate and regular rhythm.   Pulmonary/Chest: No accessory muscle usage or stridor. Tachypnea noted. No respiratory distress. She has wheezes. She has rhonchi. She has rales.  Abdominal: Soft. She exhibits no distension. There is no tenderness. There is no guarding.  Musculoskeletal: Normal range of motion. She exhibits no edema or deformity.  Neurological: She is alert.  Skin: Skin is warm and dry.  Psychiatric: She has a normal mood and affect.  Nursing note and vitals reviewed.    ED Treatments / Results  Labs (all labs ordered are listed, but only abnormal results are displayed) Labs Reviewed  CBC WITH DIFFERENTIAL/PLATELET - Abnormal; Notable for the following:       Result Value   Hemoglobin 11.7 (*)    MCHC 29.3 (*)    RDW 16.8 (*)    All other components within normal limits  BASIC METABOLIC PANEL - Abnormal; Notable for the following:    Glucose, Bld 146 (*)    All other components within normal limits  LACTIC ACID, PLASMA - Abnormal; Notable for the following:    Lactic Acid, Venous 3.1 (*)    All other components within normal limits  LACTIC ACID,  PLASMA - Abnormal; Notable for the following:    Lactic Acid, Venous 2.3 (*)    All other components within normal limits  BRAIN NATRIURETIC PEPTIDE - Abnormal; Notable for the following:    B Natriuretic Peptide 130.0 (*)    All other components within normal limits  GLUCOSE, CAPILLARY - Abnormal; Notable for the following:    Glucose-Capillary 182 (*)    All other components within normal limits  CULTURE, BLOOD (ROUTINE X 2)  CULTURE, BLOOD (ROUTINE X 2)  CULTURE, BLOOD (ROUTINE X 2)  CULTURE, BLOOD (  ROUTINE X 2)  CULTURE, EXPECTORATED SPUTUM-ASSESSMENT  GRAM STAIN  PROCALCITONIN  STREP PNEUMONIAE URINARY ANTIGEN  LEGIONELLA PNEUMOPHILA SEROGP 1 UR AG  COMPREHENSIVE METABOLIC PANEL  CBC WITH DIFFERENTIAL/PLATELET    EKG  EKG Interpretation  Date/Time:  Monday November 17 2015 17:13:08 EDT Ventricular Rate:  86 PR Interval:    QRS Duration: 153 QT Interval:  394 QTC Calculation: 472 R Axis:   38 Text Interpretation:  Sinus rhythm Atrial premature complexes Prolonged PR interval Right bundle branch block No old tracing to compare Confirmed by [facility] MD, Kaitrin Seybold [phone]) on 11/17/2015 5:18:51 PM       Radiology Dg Chest 2 View  Result Date: 11/17/2015 CLINICAL DATA:  Clinical suspicion for pneumonia. EXAM: CHEST  2 VIEW COMPARISON:  None. FINDINGS: The cardiac silhouette is enlarged. Mediastinal contours appear intact. Atherosclerotic calcifications of the aortic arch are noted. There is no evidence of pneumothorax. There is mixed alveolar and interstitial pattern pulmonary edema. More focal areas of airspace consolidation are seen in bilateral lower lobes. There are probably bilateral small to moderate pleural effusions. Osseous structures are without acute abnormality. Soft tissues are grossly normal. IMPRESSION: Enlarged cardiac silhouette. Mixed interstitial and alveolar pulmonary edema. More focal areas of airspace disease in bilateral lower lobes may represent focal airspace  consolidation versus areas of asymmetric alveolar edema. Probably bilateral pleural effusions. Electronically Signed   By: Ted Mcalpine M.D.   On: 11/17/2015 17:39   Procedures Procedures (including critical care time)  CRITICAL CARE Performed by: Marily Memos Total critical care time: 35 minutes Critical care time was exclusive of separately billable procedures and treating other patients. Critical care was necessary to treat or prevent imminent or life-threatening deterioration. Critical care was time spent personally by me on the following activities: development of treatment plan with patient and/or surrogate as well as nursing, discussions with consultants, evaluation of patient's response to treatment, examination of patient, obtaining history from patient or surrogate, ordering and performing treatments and interventions, ordering and review of laboratory studies, ordering and review of radiographic studies, pulse oximetry and re-evaluation of patient's condition.   Medications Ordered in ED Medications  vancomycin (VANCOCIN) 1,250 mg in sodium chloride 0.9 % 250 mL IVPB (not administered)  aztreonam (AZACTAM) 1 g in dextrose 5 % 50 mL IVPB (not administered)  feeding supplement (ENSURE ENLIVE) (ENSURE ENLIVE) liquid 237 mL (not administered)  aspirin EC tablet 81 mg (not administered)  polyvinyl alcohol (LIQUIFILM TEARS) 1.4 % ophthalmic solution 1 drop (1 drop Both Eyes Given 11/18/15 0028)  cholecalciferol (VITAMIN D) tablet 2,000 Units (not administered)  docusate sodium (COLACE) capsule 100 mg (not administered)  ferrous sulfate tablet 325 mg (not administered)  magnesium oxide (MAG-OX) tablet 400 mg (not administered)  polyethylene glycol (MIRALAX / GLYCOLAX) packet 17 g (17 g Oral Not Given 11/17/15 2300)  amLODipine (NORVASC) tablet 10 mg (not administered)  atorvastatin (LIPITOR) tablet 10 mg (not administered)  cloNIDine (CATAPRES) tablet 0.1 mg (not administered)    DULoxetine (CYMBALTA) DR capsule 60 mg (not administered)  gabapentin (NEURONTIN) capsule 300 mg (not administered)  insulin glargine (LANTUS) injection 30 Units (30 Units Subcutaneous Given 11/18/15 0028)  lisinopril (PRINIVIL,ZESTRIL) tablet 40 mg (not administered)  metoprolol succinate (TOPROL-XL) 24 hr tablet 100 mg (not administered)  pantoprazole (PROTONIX) EC tablet 40 mg (not administered)  pregabalin (LYRICA) capsule 50 mg (not administered)  senna (SENOKOT) tablet 17.2 mg (not administered)  acetaminophen (TYLENOL) tablet 650 mg (not administered)    Or  acetaminophen (TYLENOL)  suppository 650 mg (not administered)  ondansetron (ZOFRAN) tablet 4 mg (not administered)    Or  ondansetron (ZOFRAN) injection 4 mg (not administered)  insulin aspart (novoLOG) injection 0-9 Units (not administered)  enoxaparin (LOVENOX) injection 40 mg (40 mg Subcutaneous Given 11/18/15 0028)  metoprolol succinate (TOPROL-XL) 24 hr tablet 50 mg (not administered)  chlorhexidine (PERIDEX) 0.12 % solution 15 mL (15 mLs Mouth Rinse Given 11/18/15 0028)  antiseptic oral rinse (CPC / CETYLPYRIDINIUM CHLORIDE 0.05%) solution 7 mL (not administered)  sodium chloride 0.9 % bolus 1,000 mL (0 mLs Intravenous Stopped 11/17/15 2032)  methylPREDNISolone sodium succinate (SOLU-MEDROL) 125 mg/2 mL injection 125 mg (125 mg Intravenous Given 11/17/15 1822)  ipratropium-albuterol (DUONEB) 0.5-2.5 (3) MG/3ML nebulizer solution 3 mL (3 mLs Nebulization Given 11/17/15 1723)  vancomycin (VANCOCIN) 2,000 mg in sodium chloride 0.9 % 500 mL IVPB (2,000 mg Intravenous New Bag/Given 11/17/15 1940)    And  aztreonam (AZACTAM) 2 g in dextrose 5 % 50 mL IVPB (0 g Intravenous Stopped 11/17/15 1943)     Initial Impression / Assessment and Plan / ED Course  I have reviewed the triage vital signs and the nursing notes.  Pertinent labs & imaging results that were available during my care of the patient were reviewed by me and considered  in my medical decision making (see chart for details).  - Likely pneumonia v bronchitis. With her level of tachypnea and worsened hypoxia, suspect she will need admission but will check labs and CXR. Give HCAP abx, breathing treatmetn and steroids in meantime. No evidence for code sepsis at this point.   Clinical Course  Value Comment By Time   Reeval, still with tachypnea, requiring 4L to maintain sats. Will plan to admit.  Marily Memos, MD 07/24 2011  Lactic Acid, Venous: (!!) 3.1 Noted. Getting fluid boluses currently. Not septic shock, so will not worry about speeding up.  Marily Memos, MD 07/24 2012    Still hypoxic and tachypneic, even while sleeping. Antibiotics given. Plan for admission.   Final Clinical Impressions(s) / ED Diagnoses   Final diagnoses:  COPD exacerbation (HCC)  HCAP (healthcare-associated pneumonia)  Hypoxia    New Prescriptions Current Discharge Medication List       Marily Memos, MD 11/18/15 [phone]

## 2015-11-17 NOTE — ED Triage Notes (Addendum)
Pt is from [facility].  She has been ShOB x 2 days with productive cough with [place] phlegm.  PNA, CHF Hx.  Pt is 82% on RA.  Pt uses 2L at baseline and was 87% on 2L after Duoneb Tx. Pt is 95% at 4L

## 2015-11-18 DIAGNOSIS — G4733 Obstructive sleep apnea (adult) (pediatric): Secondary | ICD-10-CM

## 2015-11-18 DIAGNOSIS — I10 Essential (primary) hypertension: Secondary | ICD-10-CM

## 2015-11-18 LAB — PROCALCITONIN: Procalcitonin: <0.1 ng/mL

## 2015-11-18 LAB — COMPREHENSIVE METABOLIC PANEL
ALBUMIN: 3.6 g/dL (ref 3.5–5.0)
ALT: 15 U/L (ref 14–54)
ANION GAP: 5 (ref 5–15)
AST: 21 U/L (ref 15–41)
Alkaline Phosphatase: 33 U/L — ABNORMAL LOW (ref 38–126)
BILIRUBIN TOTAL: 1.1 mg/dL (ref 0.3–1.2)
BUN: 15 mg/dL (ref 6–20)
CO2: 33 mmol/L — AB (ref 22–32)
Calcium: 8.7 mg/dL — ABNORMAL LOW (ref 8.9–10.3)
Chloride: 103 mmol/L (ref 101–111)
Creatinine, Ser: 0.52 mg/dL (ref 0.44–1.00)
GFR calc Af Amer: >60 mL/min (ref >60)
GFR calc non Af Amer: >60 mL/min (ref >60)
GLUCOSE: 209 mg/dL — AB (ref 65–99)
POTASSIUM: 4.9 mmol/L (ref 3.5–5.1)
SODIUM: 141 mmol/L (ref 135–145)
TOTAL PROTEIN: 6.8 g/dL (ref 6.5–8.1)

## 2015-11-18 LAB — GLUCOSE, CAPILLARY
GLUCOSE-CAPILLARY: 188 mg/dL — AB (ref 65–99)
Glucose-Capillary: 181 mg/dL — ABNORMAL HIGH (ref 65–99)
Glucose-Capillary: 182 mg/dL — ABNORMAL HIGH (ref 65–99)
Glucose-Capillary: 197 mg/dL — ABNORMAL HIGH (ref 65–99)
Glucose-Capillary: 244 mg/dL — ABNORMAL HIGH (ref 65–99)

## 2015-11-18 LAB — CBC WITH DIFFERENTIAL/PLATELET
BASOS ABS: 0 10*3/uL (ref 0.0–0.1)
Basophils Relative: 0 %
EOS PCT: 0 %
Eosinophils Absolute: 0 10*3/uL (ref 0.0–0.7)
HEMATOCRIT: 38.2 % (ref 36.0–46.0)
Hemoglobin: 11.5 g/dL — ABNORMAL LOW (ref 12.0–15.0)
LYMPHS ABS: 0.6 10*3/uL — AB (ref 0.7–4.0)
LYMPHS PCT: 13 %
MCH: 26.1 pg (ref 26.0–34.0)
MCHC: 30.1 g/dL (ref 30.0–36.0)
MCV: 86.6 fL (ref 78.0–100.0)
MONO ABS: 0.1 10*3/uL (ref 0.1–1.0)
MONOS PCT: 2 %
NEUTROS ABS: 4.1 10*3/uL (ref 1.7–7.7)
Neutrophils Relative %: 85 %
PLATELETS: 228 10*3/uL (ref 150–400)
RBC: 4.41 MIL/uL (ref 3.87–5.11)
RDW: 17 % — AB (ref 11.5–15.5)
WBC: 4.8 10*3/uL (ref 4.0–10.5)

## 2015-11-18 LAB — D-DIMER, QUANTITATIVE: D-Dimer, Quant: 1.14 ug/mL-FEU — ABNORMAL HIGH (ref 0.00–0.50)

## 2015-11-18 LAB — LACTIC ACID, PLASMA: LACTIC ACID, VENOUS: 1.1 mmol/L (ref 0.5–1.9)

## 2015-11-18 LAB — MRSA PCR SCREENING: MRSA by PCR: NEGATIVE

## 2015-11-18 LAB — TROPONIN I: Troponin I: 0.03 ng/mL (ref <0.03)

## 2015-11-18 MED ORDER — INSULIN ASPART 100 UNIT/ML ~~LOC~~ SOLN: 0.0000 [IU] | Freq: Every day | SUBCUTANEOUS | Status: DC

## 2015-11-18 MED ORDER — INSULIN ASPART 100 UNIT/ML ~~LOC~~ SOLN
0.0000 [IU] | Freq: Three times a day (TID) | SUBCUTANEOUS | Status: DC
  Administered 2015-11-18: 3 [IU] via SUBCUTANEOUS
  Administered 2015-11-18: 5 [IU] via SUBCUTANEOUS
  Administered 2015-11-19: 2 [IU] via SUBCUTANEOUS
  Administered 2015-11-19: 5 [IU] via SUBCUTANEOUS
  Administered 2015-11-19: 2 [IU] via SUBCUTANEOUS

## 2015-11-18 NOTE — Evaluation (Signed)
Clinical/Bedside Swallow Evaluation Patient Details  Name: Sara Schmidt MRN: [id number] Date of Birth: March 31, 1931  Today's Date: 11/18/2015 Time: SLP Start Time (ACUTE ONLY): 1010 SLP Stop Time (ACUTE ONLY): 1027 SLP Time Calculation (min) (ACUTE ONLY): 17 min  Past Medical History:  Past Medical History:  Diagnosis Date  . Depression   . Diabetes (HCC)   . Hemiplegia [facility])    Past Surgical History: History reviewed. No pertinent surgical history. HPI:  82 yo female adm to [facility] with tacypnea, cough, SOB x1 week prior to admit.  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  CXR showed airspace consolidation vs asymmetric edema.  Swallow eval ordered.     Assessment / Plan / Recommendation Clinical Impression  Pt presents with minimal oral dysphagia likely due to prior CVA.  Suspect mild delay in oral transiting and minimal decreased sensation on left - requiring pt to clear with lingual sweep.  No indication of aspiration nor pharyngeal residuals noted.  NT reports pt did have some coughing with grits this am, to which pt stated, " I had too much in my mouth".  Given pt with pna, h/o CVA and mild difficulties with breakfast will follow up x1 to assure tolerance.  Provided teach back to pt for swallow compensation strategies/precautions.      Aspiration Risk  Mild aspiration risk    Diet Recommendation Regular;Thin liquid   Liquid Administration via: Cup;Straw Medication Administration: Whole meds with puree Supervision: Patient able to self feed;Comment (needs set up) Compensations: Slow rate;Small sips/bites;Lingual sweep for clearance of pocketing Postural Changes: Seated upright at 90 degrees;Remain upright for at least 30 minutes after po intake    Other  Recommendations Oral Care Recommendations: Oral care BID   Follow up Recommendations       Frequency and Duration min 1 x/week  1 week       Prognosis Prognosis for Safe Diet Advancement: Good Barriers  to Reach Goals: Cognitive deficits      Swallow Study   General Date of Onset: 11/18/15 HPI: 82 yo female adm to [facility] with tacypnea, cough, SOB x1 week prior to admit.  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  CXR showed airspace consolidation vs asymmetric edema.  Swallow eval ordered.   Type of Study: Bedside Swallow Evaluation Diet Prior to this Study: Regular;Thin liquids Temperature Spikes Noted: Yes (low grade) Respiratory Status: Room air History of Recent Intubation: No Behavior/Cognition: Alert;Cooperative;Pleasant mood Oral Cavity Assessment: Dry Oral Care Completed by SLP: No Oral Cavity - Dentition: Dentures, bottom;Dentures, top Vision: Functional for self-feeding Self-Feeding Abilities: Needs set up (pt with left HP) Patient Positioning: Upright in bed Baseline Vocal Quality: Normal Volitional Cough: Strong Volitional Swallow: Able to elicit    Oral/Motor/Sensory Function Overall Oral Motor/Sensory Function: Mild impairment (h/o right MCA CVA, mildly decrease left movement)   Ice Chips Ice chips: Not tested   Thin Liquid Thin Liquid: Impaired Presentation: Cup;Self Fed;Straw Oral Phase Functional Implications: Prolonged oral transit Other Comments: suspect prolonged oral transit     Nectar Thick Nectar Thick Liquid: Not tested   Honey Thick Honey Thick Liquid: Not tested   Puree Puree: Impaired Presentation: Spoon Oral Phase Functional Implications: Prolonged oral transit   Solid   GO   Solid: Impaired Oral Phase Impairments: Impaired mastication;Reduced lingual movement/coordination Oral Phase Functional Implications: Left lateral sulci pocketing Other Comments: pt cleared lft oral pocketing with mod I  Luanna Salk, [facility] SLP [phone]

## 2015-11-18 NOTE — Evaluation (Signed)
Physical Therapy Evaluation Patient Details Name: Sara Schmidt MRN: [id number] DOB: 1930-10-21 Today's Date: 11/18/2015   History of Present Illness  Sara Schmidt is a 82 y.o. female with stroke with left-sided hemiparesis, sleep apnea, hypertension, diabetes mellitus, hyperlipidemia was brought to the ER 7/247/17 after patient was having shortness of breath and productive cough. Patient has been having shortness of breath gradually worsening over the last 1 week. Patient also has been having productive cough with greenish phlegm.In the ER chest x-ray showing possible infiltrates and lactate levels were elevated. Admitted for healthcare associated pneumonia versus aspiration  Clinical Impression  The patient was on RA during session and noted to be more SOB. Sats were 79(% on RA.  Placed Oxygen on at 2 liters with sats returning to 95% with dips to 88%. RN in to assess and called the facility to find out if patient was on oxygen PTA. She does use a CPAP at night. Patient fell asleep after  return to supine. The patient is very motivated to mobilize. Patient was  not  Clear as to how much she gets OOB at the facility in which she resides.  Based on today's evaluation, recommend  Mechanical lift. PT will  Plan to work on bed mobility and sitting at the bed edge. Pt admitted with above diagnosis. Pt currently with functional limitations due to the deficits listed below (see PT Problem List).  Pt will benefit from skilled PT to increase their independence and safety with mobility to allow discharge to the venue listed below.     Follow Up Recommendations No PT follow up/ long term resident of SNF    Equipment Recommendations  None recommended by PT    Recommendations for Other Services       Precautions / Restrictions Precautions Precautions: Fall      Mobility  Bed Mobility Overal bed mobility: Needs Assistance Bed Mobility: Supine to Sit;Sit to Supine     Supine to sit: Max assist;HOB  elevated Sit to supine: Max assist   General bed mobility comments: use of purple slide under  buttocks to slide patient around with the bed pad. Assistance with trunk and LLE to edge and then back to  supine, assist L leg onto bed.  Transfers                 General transfer comment: will need maechanical lift.   Ambulation/Gait                Stairs            Wheelchair Mobility    Modified Rankin (Stroke Patients Only)       Balance Overall balance assessment: Needs assistance Sitting-balance support: Single extremity supported;Feet supported Sitting balance-Leahy Scale: Poor Sitting balance - Comments: with cues, can  flex forward to regain balance, generally leans posteriorly Postural control: Posterior lean                                   Pertinent Vitals/Pain Pain Assessment: Faces Faces Pain Scale: Hurts little more Pain Location: back Pain Descriptors / Indicators: Aching Pain Intervention(s): Monitored during session    Home Living Family/patient expects to be discharged to:: Skilled nursing facility                 Additional Comments: long term resident    Prior Function Level of Independence: Needs assistance   Gait / Transfers Assistance  Needed: non ambulatory  ADL's / Homemaking Assistance Needed: requires a mechanical lift for OOB        Hand Dominance        Extremity/Trunk Assessment   Upper Extremity Assessment: LUE deficits/detail       LUE Deficits / Details: dense left hemiplegia, hand in a fist with decreased finger extension   Lower Extremity Assessment: LLE deficits/detail;RLE deficits/detail RLE Deficits / Details: ankle in equino varus. LLE Deficits / Details: equinovarus andkle, able  to move the leg to the side, unable to flex hip.     Communication   Communication: No difficulties  Cognition Arousal/Alertness: Awake/alert Behavior During Therapy: WFL for tasks  assessed/performed Overall Cognitive Status: No family/caregiver present to determine baseline cognitive functioning Area of Impairment: Orientation Orientation Level: Situation;Time             General Comments: overall, is conversive and enjyos  joking    General Comments      Exercises        Assessment/Plan    PT Assessment Patient needs continued PT services  PT Diagnosis Generalized weakness   PT Problem List Decreased strength;Decreased range of motion;Decreased activity tolerance;Decreased balance;Decreased mobility;Cardiopulmonary status limiting activity  PT Treatment Interventions Functional mobility training;Therapeutic activities;Balance training   PT Goals (Current goals can be found in the Care Plan section) Acute Rehab PT Goals Patient Stated Goal: to get OOB PT Goal Formulation: With patient Time For Goal Achievement: 12/02/15 Potential to Achieve Goals: Good    Frequency Min 2X/week   Barriers to discharge        Co-evaluation               End of Session   Activity Tolerance: Treatment limited secondary to medical complications (Comment) Patient left: in bed;with call bell/phone within reach;with bed alarm set Nurse Communication: Need for lift equipment;Mobility status         Time: 1530-1601 PT Time Calculation (min) (ACUTE ONLY): 31 min   Charges:   PT Evaluation $PT Eval Moderate Complexity: 1 Procedure PT Treatments $Therapeutic Activity: 8-22 mins   PT G Codes:        Sharen Heck PT [phone]

## 2015-11-18 NOTE — [place] FL2 (Signed)
[place] MEDICAID FL2 LEVEL OF CARE SCREENING TOOL     IDENTIFICATION  Patient Name: Sara Schmidt Birthdate: 1930-07-17 Sex: female Admission Date (Current Location): 11/17/2015  [facility] and [place] Number:  Producer, television/film/video and Address:  [facility],  [address] [place], [place] [address]      Provider Number: [id number]  Attending Physician Name and Address:  Marinda Elk, MD  Relative Name and Phone Number:       Current Level of Care: Hospital Recommended Level of Care: Skilled Nursing Facility Prior Approval Number:    Date Approved/Denied:   PASRR Number:    Discharge Plan: SNF    Current Diagnoses: Patient Active Problem List   Diagnosis Date Noted  . HCAP (healthcare-associated pneumonia) 11/17/2015  . Essential hypertension 11/17/2015  . Diabetes mellitus type 2, controlled (HCC) 11/17/2015  . OSA on CPAP 11/17/2015  . Pneumonia 11/17/2015    Orientation RESPIRATION BLADDER Height & Weight     Self  Normal Continent Weight: 240 lb 15.4 oz (109.3 kg) Height:  5\' 5"  (165.1 cm)  BEHAVIORAL SYMPTOMS/MOOD NEUROLOGICAL BOWEL NUTRITION STATUS      Continent Diet (Heart Healthy/Carb Modified)  AMBULATORY STATUS COMMUNICATION OF NEEDS Skin   Extensive Assist Verbally Normal                       Personal Care Assistance Level of Assistance  Bathing, Dressing Bathing Assistance: Maximum assistance   Dressing Assistance: Maximum assistance     Functional Limitations Info             SPECIAL CARE FACTORS FREQUENCY                       Contractures      Additional Factors Info  Code Status, Allergies Code Status Info: Fullcode Allergies Info: Allergies:  Ampicillin           Current Medications (11/18/2015):  This is the current hospital active medication list Current Facility-Administered Medications  Medication Dose Route Frequency Provider Last Rate Last Dose  . acetaminophen (TYLENOL) tablet 650 mg  650  mg Oral Q6H PRN Eduard Clos, MD       Or  . acetaminophen (TYLENOL) suppository 650 mg  650 mg Rectal Q6H PRN Eduard Clos, MD      . amLODipine (NORVASC) tablet 10 mg  10 mg Oral Daily Eduard Clos, MD   10 mg at 11/18/15 1004  . antiseptic oral rinse (CPC / CETYLPYRIDINIUM CHLORIDE 0.05%) solution 7 mL  7 mL Mouth Rinse q12n4p Eduard Clos, MD      . aspirin EC tablet 81 mg  81 mg Oral Daily Eduard Clos, MD   81 mg at 11/18/15 1004  . atorvastatin (LIPITOR) tablet 10 mg  10 mg Oral QHS Eduard Clos, MD   10 mg at 11/18/15 0048  . aztreonam (AZACTAM) 1 g in dextrose 5 % 50 mL IVPB  1 g Intravenous Q8H Marily Memos, MD   1 g at 11/18/15 0414  . chlorhexidine (PERIDEX) 0.12 % solution 15 mL  15 mL Mouth Rinse BID Eduard Clos, MD   15 mL at 11/18/15 1005  . cholecalciferol (VITAMIN D) tablet 2,000 Units  2,000 Units Oral Daily Eduard Clos, MD   2,000 Units at 11/18/15 1004  . cloNIDine (CATAPRES) tablet 0.1 mg  0.1 mg Oral Q12H Eduard Clos, MD   0.1 mg at  11/18/15 1004  . docusate sodium (COLACE) capsule 100 mg  100 mg Oral BID Eduard Clos, MD   100 mg at 11/18/15 1004  . DULoxetine (CYMBALTA) DR capsule 60 mg  60 mg Oral Daily Eduard Clos, MD   60 mg at 11/18/15 1004  . enoxaparin (LOVENOX) injection 40 mg  40 mg Subcutaneous QHS Eduard Clos, MD   40 mg at 11/18/15 0028  . feeding supplement (ENSURE ENLIVE) (ENSURE ENLIVE) liquid 237 mL  237 mL Oral BID BM Eduard Clos, MD   237 mL at 11/18/15 1000  . ferrous sulfate tablet 325 mg  325 mg Oral Q breakfast Eduard Clos, MD   325 mg at 11/18/15 0900  . gabapentin (NEURONTIN) capsule 300 mg  300 mg Oral TID Eduard Clos, MD   300 mg at 11/18/15 1004  . insulin aspart (novoLOG) injection 0-15 Units  0-15 Units Subcutaneous TID WC Marinda Elk, MD      . insulin aspart (novoLOG) injection 0-5 Units  0-5 Units Subcutaneous QHS Marinda Elk, MD      . insulin glargine (LANTUS) injection 30 Units  30 Units Subcutaneous Q12H Eduard Clos, MD   30 Units at 11/18/15 1002  . lisinopril (PRINIVIL,ZESTRIL) tablet 40 mg  40 mg Oral Daily Eduard Clos, MD   40 mg at 11/18/15 1004  . magnesium oxide (MAG-OX) tablet 400 mg  400 mg Oral Daily Eduard Clos, MD   400 mg at 11/18/15 1005  . metoprolol succinate (TOPROL-XL) 24 hr tablet 100 mg  100 mg Oral Daily Eduard Clos, MD   100 mg at 11/18/15 1005  . metoprolol succinate (TOPROL-XL) 24 hr tablet 50 mg  50 mg Oral QHS Eduard Clos, MD      . ondansetron [facility]) tablet 4 mg  4 mg Oral Q6H PRN Eduard Clos, MD       Or  . ondansetron [facility]) injection 4 mg  4 mg Intravenous Q6H PRN Eduard Clos, MD      . pantoprazole (PROTONIX) EC tablet 40 mg  40 mg Oral Daily Eduard Clos, MD   40 mg at 11/18/15 1005  . polyethylene glycol (MIRALAX / GLYCOLAX) packet 17 g  17 g Oral QHS Eduard Clos, MD      . polyvinyl alcohol (LIQUIFILM TEARS) 1.4 % ophthalmic solution 1 drop  1 drop Both Eyes BID Eduard Clos, MD   1 drop at 11/18/15 1005  . pregabalin (LYRICA) capsule 50 mg  50 mg Oral Daily Eduard Clos, MD   50 mg at 11/18/15 1004  . senna (SENOKOT) tablet 17.2 mg  2 tablet Oral QHS Eduard Clos, MD   17.2 mg at 11/18/15 0048  . vancomycin (VANCOCIN) 1,250 mg in sodium chloride 0.9 % 250 mL IVPB  1,250 mg Intravenous Q24H Marily Memos, MD         Discharge Medications: Please see discharge summary for a list of discharge medications.  Relevant Imaging Results:  Relevant Lab Results:   Additional Information SSN: [id number]  Arlyss Repress, LCSW

## 2015-11-18 NOTE — Clinical Social Work Note (Signed)
Clinical Social Work Assessment  Patient Details  Name: Sara Schmidt MRN: [id number] Date of Birth: 1930-09-08  Date of referral:  11/18/15               Reason for consult:  Facility Placement                Permission sought to share information with:  Facility Industrial/product designer granted to share information::  Yes, Verbal Permission Granted  Name::        Agency::     Relationship::     Contact Information:     Housing/Transportation Living arrangements for the past 2 months:  Skilled Nursing Facility Source of Information:  Patient, Other (Comment Required) (cousin, Sam ) Patient Interpreter Needed:  None Criminal Activity/Legal Involvement Pertinent to Current Situation/Hospitalization:  No - Comment as needed Significant Relationships:  Adult Children, Other Family Members Lives with:  Facility Resident Do you feel safe going back to the place where you live?  Yes Need for family participation in patient care:  Yes (Comment)  Care giving concerns:  CSW received consult that patient was admitted from [facility].    Social Worker assessment / plan:  CSW confirmed with patient & cousin, Sam via phone (ph#: [phone]) that patient plans to return to [place] at discharge. Patient has been a resident of [facility] since 2015. CSW has tried multiple times to get in touch with patient's son that is listed on the facesheet that came from [facility], ph#: [phone] is not accepting incoming calls & the other phone # listed [phone]) is an incorrect number.   Employment status:  Retired Health and safety inspector:  Medicaid [place] PT Recommendations:  Not assessed at this time Information / Referral to community resources:  Skilled Nursing Facility  Patient/Family's Response to care:  Patient is pleasant, likes to talk about her sister & family members though is alert & oriented x2.   Patient/Family's Understanding of and Emotional Response to  Diagnosis, Current Treatment, and Prognosis:    Emotional Assessment Appearance:  Appears stated age Attitude/Demeanor/Rapport:    Affect (typically observed):  Calm, Pleasant Orientation:  Oriented to Self Alcohol / Substance use:    Psych involvement (Current and /or in the community):     Discharge Needs  Concerns to be addressed:    Readmission within the last 30 days:    Current discharge risk:    Barriers to Discharge:      Arlyss Repress, LCSW 11/18/2015, 11:46 AM

## 2015-11-18 NOTE — Progress Notes (Signed)
[facility] PROGRESS NOTE    Progress Note  Sara Schmidt  ZOX:[id number] DOB: 1930/06/24 DOA: 11/17/2015 PCP: Katy Apo, MD     Brief Narrative:   Sara Schmidt is an 82 y.o. female past medical history stroke with left-sided hemiparesis, essential hypertension, diabetes mellitus hyperlipidemia that comes in with productive cough and shortness of breath.  Assessment/Plan:   HCAP (healthcare-associated pneumonia): Swallowing evaluation is pending. Sputum cultures and urine studies are pending. Continue IV vancomycin and aztreonam. I do not think she is septic she had a mild elevation in her lactic acid which is now resolved question due to dehydration.  New acute encephalopathy: Likely due to infectious etiology in nature. Now resolved.  Essential hypertension: Continue lisinopril metoprolol clonidine and Norvasc. I agree with holding Lasix.  Mild elevation in cardiac biomarkers: EKG shows a right axis deviation right bundle branch block with nonspecific T-wave changes.  Diabetes mellitus type 2, controlled (HCC): CBGs continue to be high. Continue long-acting insulin and I will add resistent  sliding scale due to her obesity.  OSA on CPAP: On C-pap.  Multifocal atrial tachycardia Probably due to healthcare associated pneumonia we'll continue to monitor on telemetry her heart rate has been ranging 100-106.  Morbidly obese  DVT prophylaxis: lovenox Family Communication:hone Disposition Plan/Barrier to D/C: home once infection control. Code Status:     Code Status Orders        Start     Ordered   11/17/15 2245  Full code  Continuous     11/17/15 2247    Code Status History    Date Active Date Inactive Code Status Order ID Comments User Context   This patient has a current code status but no historical code status.        IV Access:    Peripheral IV   Procedures and diagnostic studies:   Dg Chest 2 View  Result Date:  11/17/2015 CLINICAL DATA:  Clinical suspicion for pneumonia. EXAM: CHEST  2 VIEW COMPARISON:  None. FINDINGS: The cardiac silhouette is enlarged. Mediastinal contours appear intact. Atherosclerotic calcifications of the aortic arch are noted. There is no evidence of pneumothorax. There is mixed alveolar and interstitial pattern pulmonary edema. More focal areas of airspace consolidation are seen in bilateral lower lobes. There are probably bilateral small to moderate pleural effusions. Osseous structures are without acute abnormality. Soft tissues are grossly normal. IMPRESSION: Enlarged cardiac silhouette. Mixed interstitial and alveolar pulmonary edema. More focal areas of airspace disease in bilateral lower lobes may represent focal airspace consolidation versus areas of asymmetric alveolar edema. Probably bilateral pleural effusions. Electronically Signed   By: Ted Mcalpine M.D.   On: 11/17/2015 17:39    Medical Consultants:    None.  Anti-Infectives:   IV vancomycin and aztreonam.  Subjective:    Sara Schmidt related breathing is better once she go home.  Objective:    Vitals:   11/17/15 2215 11/18/15 0121 11/18/15 0511 11/18/15 0530  BP: (!) 143/59  (!) 153/78   Pulse: 80  80   Resp: (!) 28 20 (!) 28   Temp: 97.4 F (36.3 C)  97.4 F (36.3 C)   TempSrc: Oral  Axillary   SpO2: 94%  (!) 84% 93%  Weight: 109.3 kg (240 lb 15.4 oz)     Height:        Intake/Output Summary (Last 24 hours) at 11/18/15 0806 Last data filed at 11/17/15 2032  Gross per 24 hour  Intake  1050 ml  Output                0 ml  Net             1050 ml   Filed Weights   11/17/15 1756 11/17/15 2215  Weight: 104.8 kg (231 lb) 109.3 kg (240 lb 15.4 oz)    Exam: General exam: In no acute distress.Morbidly obese Respiratory system: Good air movement and Normal bilateral crackles. Cardiovascular system: S1 & S2 heard, RRR.  Gastrointestinal system: Abdomen is nondistended, soft and  nontender.  Central nervous system: Alert and oriented. No focal neurological deficits. Extremities: No pedal edema. Skin: No rashes, lesions or ulcers Psychiatry: Judgement and insight appear normal. Mood & affect appropriate.    Data Reviewed:    Labs: Basic Metabolic Panel:  Recent Labs Lab 11/17/15 1828 11/18/15 0513  NA 141 141  K 4.2 4.9  CL 101 103  CO2 32 33*  GLUCOSE 146* 209*  BUN 15 15  CREATININE 0.73 0.52  CALCIUM 9.2 8.7*   GFR Estimated Creatinine Clearance: 65.5 mL/min (by C-G formula based on SCr of 0.8 mg/dL). Liver Function Tests:  Recent Labs Lab 11/18/15 0513  AST 21  ALT 15  ALKPHOS 33*  BILITOT 1.1  PROT 6.8  ALBUMIN 3.6   No results for input(s): LIPASE, AMYLASE in the last 168 hours. No results for input(s): AMMONIA in the last 168 hours. Coagulation profile No results for input(s): INR, PROTIME in the last 168 hours.  CBC:  Recent Labs Lab 11/17/15 1828 11/18/15 0513  WBC 7.4 4.8  NEUTROABS 5.0 4.1  HGB 11.7* 11.5*  HCT 40.0 38.2  MCV 89.5 86.6  PLT 220 228   Cardiac Enzymes:  Recent Labs Lab 11/18/15 0705  TROPONINI 0.03*   BNP (last 3 results) No results for input(s): PROBNP in the last 8760 hours. CBG:  Recent Labs Lab 11/18/15 0008  GLUCAP 182*   D-Dimer:  Recent Labs  11/18/15 0705  DDIMER 1.14*   Hgb A1c: No results for input(s): HGBA1C in the last 72 hours. Lipid Profile: No results for input(s): CHOL, HDL, LDLCALC, TRIG, CHOLHDL, LDLDIRECT in the last 72 hours. Thyroid function studies: No results for input(s): TSH, T4TOTAL, T3FREE, THYROIDAB in the last 72 hours.  Invalid input(s): FREET3 Anemia work up: No results for input(s): VITAMINB12, FOLATE, FERRITIN, TIBC, IRON, RETICCTPCT in the last 72 hours. Sepsis Labs:  Recent Labs Lab 11/17/15 1828 11/17/15 1838 11/17/15 2013 11/18/15 0513 11/18/15 0705  PROCALCITON  --  <0.10  --   --   --   WBC 7.4  --   --  4.8  --   LATICACIDVEN  3.1*  --  2.3*  --  1.1   Microbiology No results found for this or any previous visit (from the past 240 hour(s)).   Medications:   . amLODipine  10 mg Oral Daily  . antiseptic oral rinse  7 mL Mouth Rinse q12n4p  . aspirin EC  81 mg Oral Daily  . atorvastatin  10 mg Oral QHS  . aztreonam  1 g Intravenous Q8H  . chlorhexidine  15 mL Mouth Rinse BID  . cholecalciferol  2,000 Units Oral Daily  . cloNIDine  0.1 mg Oral Q12H  . docusate sodium  100 mg Oral BID  . DULoxetine  60 mg Oral Daily  . enoxaparin (LOVENOX) injection  40 mg Subcutaneous QHS  . feeding supplement (ENSURE ENLIVE)  237 mL Oral BID BM  .  ferrous sulfate  325 mg Oral Q breakfast  . gabapentin  300 mg Oral TID  . insulin aspart  0-9 Units Subcutaneous TID WC  . insulin glargine  30 Units Subcutaneous Q12H  . lisinopril  40 mg Oral Daily  . magnesium oxide  400 mg Oral Daily  . metoprolol succinate  100 mg Oral Daily  . metoprolol succinate  50 mg Oral QHS  . pantoprazole  40 mg Oral Daily  . polyethylene glycol  17 g Oral QHS  . polyvinyl alcohol  1 drop Both Eyes BID  . pregabalin  50 mg Oral Daily  . senna  2 tablet Oral QHS  . vancomycin  1,250 mg Intravenous Q24H   Continuous Infusions:   Time spent: 25 min   LOS: 1 day   Marinda Elk  [facility] Pager [phone]  *Please refer to [email or web address], password [email or web address] to get updated schedule on who will round on this patient, as hospitalists switch teams weekly. If 7PM-7AM, please contact night-coverage at [email or web address], password [email or web address] for any overnight needs.  11/18/2015, 8:06 AM

## 2015-11-19 ENCOUNTER — Inpatient Hospital Stay (HOSPITAL_COMMUNITY): Payer: Medicare Other

## 2015-11-19 ENCOUNTER — Encounter (HOSPITAL_COMMUNITY): Payer: Self-pay | Admitting: Radiology

## 2015-11-19 DIAGNOSIS — Z794 Long term (current) use of insulin: Secondary | ICD-10-CM

## 2015-11-19 DIAGNOSIS — J441 Chronic obstructive pulmonary disease with (acute) exacerbation: Secondary | ICD-10-CM

## 2015-11-19 DIAGNOSIS — R0902 Hypoxemia: Secondary | ICD-10-CM

## 2015-11-19 DIAGNOSIS — J189 Pneumonia, unspecified organism: Secondary | ICD-10-CM

## 2015-11-19 DIAGNOSIS — R0600 Dyspnea, unspecified: Secondary | ICD-10-CM

## 2015-11-19 DIAGNOSIS — E119 Type 2 diabetes mellitus without complications: Secondary | ICD-10-CM

## 2015-11-19 LAB — TROPONIN I: Troponin I: 0.04 ng/mL (ref <0.03)

## 2015-11-19 LAB — BLOOD GAS, ARTERIAL
ACID-BASE EXCESS: 8.3 mmol/L — AB (ref 0.0–2.0)
Acid-Base Excess: 8.8 mmol/L — ABNORMAL HIGH (ref 0.0–2.0)
Bicarbonate: 37.7 mEq/L — ABNORMAL HIGH (ref 20.0–24.0)
Bicarbonate: 36.6 mEq/L — ABNORMAL HIGH (ref 20.0–24.0)
DRAWN BY: 331471
Delivery systems: POSITIVE
Drawn by: 308601
Expiratory PAP: 6
FIO2: 0.3
INSPIRATORY PAP: 16
Mode: POSITIVE
O2 CONTENT: 4 L/min
O2 Saturation: 89 %
O2 Saturation: 92.7 %
PATIENT TEMPERATURE: 98.6
PATIENT TEMPERATURE: 98.6
PCO2 ART: 88.7 mmHg — AB (ref 35.0–45.0)
PO2 ART: 68.5 mmHg — AB (ref 80.0–100.0)
TCO2: 36 mmol/L (ref 0–100)
TCO2: 34.5 mmol/L (ref 0–100)
pCO2 arterial: 74.4 mmHg (ref 35.0–45.0)
pH, Arterial: 7.252 — ABNORMAL LOW (ref 7.350–7.450)
pH, Arterial: 7.314 — ABNORMAL LOW (ref 7.350–7.450)
pO2, Arterial: 64.6 mmHg — ABNORMAL LOW (ref 80.0–100.0)

## 2015-11-19 LAB — GLUCOSE, CAPILLARY
GLUCOSE-CAPILLARY: 210 mg/dL — AB (ref 65–99)
GLUCOSE-CAPILLARY: 97 mg/dL (ref 65–99)
Glucose-Capillary: 143 mg/dL — ABNORMAL HIGH (ref 65–99)
Glucose-Capillary: 126 mg/dL — ABNORMAL HIGH (ref 65–99)

## 2015-11-19 LAB — MRSA PCR SCREENING: MRSA BY PCR: NEGATIVE

## 2015-11-19 LAB — EXPECTORATED SPUTUM ASSESSMENT W REFEX TO RESP CULTURE

## 2015-11-19 LAB — ECHOCARDIOGRAM COMPLETE
HEIGHTINCHES: 65 in
WEIGHTICAEL: 3883.62 [oz_av]

## 2015-11-19 LAB — HEMOGLOBIN A1C
HEMOGLOBIN A1C: 6.4 % — AB (ref 4.8–5.6)
Mean Plasma Glucose: 137 mg/dL

## 2015-11-19 LAB — EXPECTORATED SPUTUM ASSESSMENT W GRAM STAIN, RFLX TO RESP C

## 2015-11-19 LAB — STREP PNEUMONIAE URINARY ANTIGEN: Strep Pneumo Urinary Antigen: NEGATIVE

## 2015-11-19 MED ORDER — IOPAMIDOL (ISOVUE-370) INJECTION 76%
100.0000 mL | Freq: Once | INTRAVENOUS | Status: AC | PRN
  Administered 2015-11-19: 100 mL via INTRAVENOUS

## 2015-11-19 MED ORDER — PERFLUTREN LIPID MICROSPHERE
INTRAVENOUS | Status: AC
  Filled 2015-11-19: qty 10

## 2015-11-19 MED ORDER — CLINDAMYCIN PHOSPHATE 600 MG/50ML IV SOLN
600.0000 mg | Freq: Three times a day (TID) | INTRAVENOUS | Status: DC
  Administered 2015-11-19 – 2015-11-21 (×6): 600 mg via INTRAVENOUS
  Filled 2015-11-19 (×7): qty 50

## 2015-11-19 MED ORDER — ENSURE ENLIVE PO LIQD: 237.0000 mL | ORAL | Status: DC

## 2015-11-19 MED ORDER — ZOLPIDEM TARTRATE 5 MG PO TABS: 5.0000 mg | ORAL_TABLET | Freq: Once | ORAL | Status: DC

## 2015-11-19 MED ORDER — GUAIFENESIN-DM 100-10 MG/5ML PO SYRP
5.0000 mL | ORAL_SOLUTION | ORAL | Status: DC | PRN
  Administered 2015-11-19: 5 mL via ORAL
  Filled 2015-11-19: qty 10

## 2015-11-19 MED ORDER — INSULIN ASPART 100 UNIT/ML ~~LOC~~ SOLN
0.0000 [IU] | SUBCUTANEOUS | Status: DC
  Administered 2015-11-20: 3 [IU] via SUBCUTANEOUS
  Administered 2015-11-20 – 2015-11-21 (×4): 4 [IU] via SUBCUTANEOUS
  Administered 2015-11-21 – 2015-11-22 (×4): 3 [IU] via SUBCUTANEOUS
  Administered 2015-11-22 (×2): 4 [IU] via SUBCUTANEOUS

## 2015-11-19 MED ORDER — ZOLPIDEM TARTRATE 5 MG PO TABS
ORAL_TABLET | ORAL | Status: AC
  Filled 2015-11-19: qty 1

## 2015-11-19 MED ORDER — FAMOTIDINE IN NACL 20-0.9 MG/50ML-% IV SOLN
20.0000 mg | Freq: Two times a day (BID) | INTRAVENOUS | Status: DC
  Administered 2015-11-19 – 2015-11-21 (×4): 20 mg via INTRAVENOUS
  Filled 2015-11-19 (×4): qty 50

## 2015-11-19 MED ORDER — FUROSEMIDE 10 MG/ML IJ SOLN
20.0000 mg | Freq: Once | INTRAMUSCULAR | Status: AC
  Administered 2015-11-19: 20 mg via INTRAVENOUS
  Filled 2015-11-19: qty 2

## 2015-11-19 MED ORDER — PERFLUTREN LIPID MICROSPHERE
1.0000 mL | INTRAVENOUS | Status: DC | PRN
  Administered 2015-11-19: 4 mL via INTRAVENOUS
  Filled 2015-11-19: qty 10

## 2015-11-19 MED ORDER — FUROSEMIDE 10 MG/ML IJ SOLN
INTRAMUSCULAR | Status: AC
  Filled 2015-11-19: qty 2

## 2015-11-19 MED ORDER — FUROSEMIDE 10 MG/ML IJ SOLN
20.0000 mg | Freq: Once | INTRAMUSCULAR | Status: AC
  Administered 2015-11-19: 20 mg via INTRAVENOUS

## 2015-11-19 NOTE — Progress Notes (Signed)
  Echocardiogram 2D Echocardiogram has been performed.  Arvil Chaco 11/19/2015, 2:00 PM

## 2015-11-19 NOTE — Progress Notes (Signed)
Patient transferred to room 1232 per MD order. Patient lethargic upon transfer but able to be aroused by touch. Report given to receiving RN. MD Gherghe paged to be made aware of ABG results- pH 7.25, CO2- 88.7, O2- 64.6, Bicarb- 37.7. Patient to be placed on BiPAP.

## 2015-11-19 NOTE — Progress Notes (Addendum)
PROGRESS NOTE  Sara Schmidt ZOX:[id number] DOB: [birth year] DOA: 11/17/2015 PCP: Katy Apo, MD   LOS: 2 days   Brief Narrative: Sara Schmidt is an 82 y.o. female past medical history stroke with left-sided hemiparesis, essential hypertension, diabetes mellitus hyperlipidemia that comes in with productive cough and shortness of breath.  Assessment & Plan: Principal Problem:   HCAP (healthcare-associated pneumonia) Active Problems:   Essential hypertension   Diabetes mellitus type 2, controlled (HCC)   OSA on CPAP   Pneumonia   Addendum 6:30 pm Paged by RN that patient has been increasingly lethargic this afternoon and less responsive. Evaluated bedside, she is able to wake up when called, answers basic questions but falls back asleep in seconds.STAT ABG with acute hypercarbic respiratory failure and respiratory acidosis. Will transfer to SDU, place on BiPAP. I am concerned about her ability to protect her airway with her CVA history, slurred speech and concern for aspiration, and I consulted critical care to see patient in consultation and determine need for mechanical ventilation. Appreciate input.   HCAP (healthcare-associated pneumonia) - component of aspiration as well - MRSA negative, discontinue Vancomycin - add clindamycin for anaerobic coverage - Sputum cultures and urine studies are pending. - Strep pneumo antigen negative  Mild elevation in cardiac biomarkers - EKG shows a right axis deviation right bundle branch block with nonspecific T-wave changes. - fluid overload on admssion CXR - obtain a 2D echo - Repeat troponin today  New acute encephalopathy - Likely due to infectious etiology in nature. - Now resolved  Essential hypertension - Continue lisinopril metoprolol clonidine and Norvasc. Resume Lasix  Diabetes mellitus type 2, controlled (HCC) - CBGs controlled. Most recent 143 - Continue long-acting insulin and resistent sliding scale  OSA on CPAP -  On C-pap.  Multifocal atrial tachycardia - Probably due to healthcare associated pneumonia we'll continue to monitor on telemetry her heart rate has been ranging 100-106.  Morbidly obese  History of CVA - With residual left-sided weakness, slurred speech - Currently residing in an SNF   DVT prophylaxis: Lovenox Code Status: Full code Family Communication: No family at bedside Disposition Plan: SNF 1-2 days  Consultants:   None  Procedures:   2D echo: Pending  Antimicrobials:  Vancomycin 7/24 >> 7/26  Aztreonam 7/24 >>  Clindamycin 7/26 >>   Subjective: - no chest pain, shortness of breath, no abdominal pain, nausea or vomiting. Coughing  Objective: Vitals:   11/18/15 2122 11/18/15 2234 11/19/15 0504 11/19/15 1037  BP: (!) 153/82  128/78 122/70  Pulse: 82 93 81 83  Resp: 20 20 (!) 22   Temp: 99.3 F (37.4 C)  97.7 F (36.5 C)   TempSrc: Oral  Oral   SpO2: 95% 92% 95% 96%  Weight:   110.1 kg (242 lb 11.6 oz)   Height:        Intake/Output Summary (Last 24 hours) at 11/19/15 1055 Last data filed at 11/19/15 0304  Gross per 24 hour  Intake              400 ml  Output              350 ml  Net               50 ml   Filed Weights   11/17/15 1756 11/17/15 2215 11/19/15 0504  Weight: 104.8 kg (231 lb) 109.3 kg (240 lb 15.4 oz) 110.1 kg (242 lb 11.6 oz)    Examination: Constitutional: NAD Vitals:   11/18/15  2122 11/18/15 2234 11/19/15 0504 11/19/15 1037  BP: (!) 153/82  128/78 122/70  Pulse: 82 93 81 83  Resp: 20 20 (!) 22   Temp: 99.3 F (37.4 C)  97.7 F (36.5 C)   TempSrc: Oral  Oral   SpO2: 95% 92% 95% 96%  Weight:   110.1 kg (242 lb 11.6 oz)   Height:       Eyes: PERRL ENMT: Mucous membranes are moist. Neck: normal, supple, no masses, no thyromegaly Respiratory: no wheezing, no crackles. Normal respiratory effort. No accessory muscle use. Coarse gurgling breath sounds Cardiovascular: Regular rate and rhythm, no murmurs / rubs / gallops.    Abdomen: no tenderness. Bowel sounds positive.  Musculoskeletal: no clubbing / cyanosis.  Skin: no rashes, lesions, ulcers. No induration Neurologic: left sided weakness   Data Reviewed: I have personally reviewed following labs and imaging studies  CBC:  Recent Labs Lab 11/17/15 1828 11/18/15 0513  WBC 7.4 4.8  NEUTROABS 5.0 4.1  HGB 11.7* 11.5*  HCT 40.0 38.2  MCV 89.5 86.6  PLT 220 228   Basic Metabolic Panel:  Recent Labs Lab 11/17/15 1828 11/18/15 0513  NA 141 141  K 4.2 4.9  CL 101 103  CO2 32 33*  GLUCOSE 146* 209*  BUN 15 15  CREATININE 0.73 0.52  CALCIUM 9.2 8.7*   GFR: Estimated Creatinine Clearance: 65.8 mL/min (by C-G formula based on SCr of 0.8 mg/dL). Liver Function Tests:  Recent Labs Lab 11/18/15 0513  AST 21  ALT 15  ALKPHOS 33*  BILITOT 1.1  PROT 6.8  ALBUMIN 3.6   No results for input(s): LIPASE, AMYLASE in the last 168 hours. No results for input(s): AMMONIA in the last 168 hours. Coagulation Profile: No results for input(s): INR, PROTIME in the last 168 hours. Cardiac Enzymes:  Recent Labs Lab 11/18/15 0705  TROPONINI 0.03*   BNP (last 3 results) No results for input(s): PROBNP in the last 8760 hours. HbA1C:  Recent Labs  11/18/15 0513  HGBA1C 6.4*   CBG:  Recent Labs Lab 11/18/15 0754 11/18/15 1205 11/18/15 1626 11/18/15 2144 11/19/15 0803  GLUCAP 188* 244* 197* 181* 143*   Lipid Profile: No results for input(s): CHOL, HDL, LDLCALC, TRIG, CHOLHDL, LDLDIRECT in the last 72 hours. Thyroid Function Tests: No results for input(s): TSH, T4TOTAL, FREET4, T3FREE, THYROIDAB in the last 72 hours. Anemia Panel: No results for input(s): VITAMINB12, FOLATE, FERRITIN, TIBC, IRON, RETICCTPCT in the last 72 hours. Urine analysis: No results found for: COLORURINE, APPEARANCEUR, LABSPEC, PHURINE, GLUCOSEU, HGBUR, BILIRUBINUR, KETONESUR, PROTEINUR, UROBILINOGEN, NITRITE, LEUKOCYTESUR Sepsis Labs: Invalid input(s):  PROCALCITONIN, LACTICIDVEN  Recent Results (from the past 240 hour(s))  Culture, blood (routine x 2)     Status: None (Preliminary result)   Collection Time: 11/17/15  6:29 PM  Result Value Ref Range Status   Specimen Description BLOOD RIGHT HAND  Final   Special Requests BOTTLES DRAWN AEROBIC AND ANAEROBIC 5CC  Final   Culture   Final    NO GROWTH < 24 HOURS Performed at [facility]    Report Status PENDING  Incomplete  Culture, blood (routine x 2)     Status: None (Preliminary result)   Collection Time: 11/17/15  6:34 PM  Result Value Ref Range Status   Specimen Description BLOOD LEFT HAND  Final   Special Requests BOTTLES DRAWN AEROBIC AND ANAEROBIC 5CC  Final   Culture   Final    NO GROWTH < 24 HOURS Performed at [facility]    Report Status PENDING  Incomplete  MRSA PCR Screening     Status: None   Collection Time: 11/18/15  4:15 PM  Result Value Ref Range Status   MRSA by PCR NEGATIVE NEGATIVE Final    Comment:        The GeneXpert MRSA Assay (FDA approved for NASAL specimens only), is one component of a comprehensive MRSA colonization surveillance program. It is not intended to diagnose MRSA infection nor to guide or monitor treatment for MRSA infections.       Radiology Studies: Dg Chest 2 View  Result Date: 11/17/2015 CLINICAL DATA:  Clinical suspicion for pneumonia. EXAM: CHEST  2 VIEW COMPARISON:  None. FINDINGS: The cardiac silhouette is enlarged. Mediastinal contours appear intact. Atherosclerotic calcifications of the aortic arch are noted. There is no evidence of pneumothorax. There is mixed alveolar and interstitial pattern pulmonary edema. More focal areas of airspace consolidation are seen in bilateral lower lobes. There are probably bilateral small to moderate pleural effusions. Osseous structures are without acute abnormality. Soft tissues are grossly normal. IMPRESSION: Enlarged cardiac silhouette. Mixed interstitial and alveolar  pulmonary edema. More focal areas of airspace disease in bilateral lower lobes may represent focal airspace consolidation versus areas of asymmetric alveolar edema. Probably bilateral pleural effusions. Electronically Signed   By: Ted Mcalpine M.D.   On: 11/17/2015 17:39    Scheduled Meds: . amLODipine  10 mg Oral Daily  . antiseptic oral rinse  7 mL Mouth Rinse q12n4p  . aspirin EC  81 mg Oral Daily  . atorvastatin  10 mg Oral QHS  . aztreonam  1 g Intravenous Q8H  . chlorhexidine  15 mL Mouth Rinse BID  . cholecalciferol  2,000 Units Oral Daily  . cloNIDine  0.1 mg Oral Q12H  . docusate sodium  100 mg Oral BID  . DULoxetine  60 mg Oral Daily  . enoxaparin (LOVENOX) injection  40 mg Subcutaneous QHS  . feeding supplement (ENSURE ENLIVE)  237 mL Oral BID BM  . ferrous sulfate  325 mg Oral Q breakfast  . gabapentin  300 mg Oral TID  . insulin aspart  0-15 Units Subcutaneous TID WC  . insulin aspart  0-5 Units Subcutaneous QHS  . insulin glargine  30 Units Subcutaneous Q12H  . lisinopril  40 mg Oral Daily  . magnesium oxide  400 mg Oral Daily  . metoprolol succinate  100 mg Oral Daily  . metoprolol succinate  50 mg Oral QHS  . pantoprazole  40 mg Oral Daily  . polyethylene glycol  17 g Oral QHS  . polyvinyl alcohol  1 drop Both Eyes BID  . pregabalin  50 mg Oral Daily  . senna  2 tablet Oral QHS  . vancomycin  1,250 mg Intravenous Q24H  . zolpidem  5 mg Oral Once   Continuous Infusions:    Pamella Pert, MD, PhD [facility] Pager [phone]  If 7PM-7AM, please contact night-coverage [email or web address] Password [facility] 11/19/2015, 10:55 AM

## 2015-11-19 NOTE — Consult Note (Signed)
PULMONARY / CRITICAL CARE MEDICINE   Name: Sara Schmidt MRN: [id number] DOB: 1930/05/09    ADMISSION DATE:  11/17/2015 CONSULTATION DATE:  11/19/2015  REFERRING MD:  Dr. Lafe Garin, [facility]  CHIEF COMPLAINT:  Short of breath  HISTORY OF PRESENT ILLNESS:   Hx from chart and medical staff.  82 yo female from [facility] with dyspnea, productive cough (Morris phlegm), and hypoxia.  Symptoms had been present for roughly 1 week. She had associated left sided pleuritic chest pain. She has hx of CVA with Lt hemiparesis with concern for dysphagia and aspiration. She had elevated lactic acid.  She was admitted, started on Abx for pneumonia, given fluid boluses, and was seen by speech therapy.   On afternoon of 7/26, she became more somnolent and somewhat lethargic.  ABG showed acute on chronic hypercapnia.  She was transferred to ICU and started on BiPAP and PCCM was called for further recs.   PAST MEDICAL HISTORY :  She  has a past medical history of Depression; Diabetes (HCC); and Hemiplegia (HCC).  PAST SURGICAL HISTORY: Unable to obtain.  Allergies  Allergen Reactions  . Ampicillin Shortness Of Breath    Reaction:  Unknown  Has patient had a PCN reaction causing immediate rash, facial/tongue/throat swelling, SOB or lightheadedness with hypotension:  Unsure Has patient had a PCN reaction causing severe rash involving mucus membranes or skin necrosis: Unsure Has patient had a PCN reaction that required hospitalization Unsure Has patient had a PCN reaction occurring within the last 10 years: Unsure If all of the above answers are "NO", then may proceed with Cephalosporin use.    No current facility-administered medications on file prior to encounter.    Current Outpatient Prescriptions on File Prior to Encounter  Medication Sig  . acetaminophen (TYLENOL) 650 MG CR tablet Take 650 mg by mouth every 12 (twelve) hours.   Marland Kitchen amLODipine (NORVASC) 10 MG tablet Take 10 mg by mouth daily.  Marland Kitchen  atorvastatin (LIPITOR) 10 MG tablet Take 10 mg by mouth at bedtime.   . cloNIDine (CATAPRES) 0.1 MG tablet Take 0.1 mg by mouth every 12 (twelve) hours.   . DULoxetine (CYMBALTA) 60 MG capsule Take 60 mg by mouth daily.  . furosemide (LASIX) 20 MG tablet Take 20 mg by mouth daily.   Marland Kitchen gabapentin (NEURONTIN) 300 MG capsule Take 300 mg by mouth 3 (three) times daily.  . insulin glargine (LANTUS) 100 UNIT/ML injection Inject 30 Units into the skin every 12 (twelve) hours.   Marland Kitchen lisinopril (PRINIVIL,ZESTRIL) 40 MG tablet Take 40 mg by mouth daily.  . metFORMIN (GLUCOPHAGE) 1000 MG tablet Take 1,000 mg by mouth 2 (two) times daily with a meal.  . metoprolol succinate (TOPROL-XL) 50 MG 24 hr tablet Take 50-100 mg by mouth 2 (two) times daily. Pt takes two tablets in the morning and one tablet at bedtime.  Marland Kitchen omeprazole (PRILOSEC) 20 MG capsule Take 20 mg by mouth daily before breakfast.   . pregabalin (LYRICA) 50 MG capsule Take 50 mg by mouth daily.   Marland Kitchen senna (SENOKOT) 8.6 MG tablet Take 2 tablets by mouth at bedtime.     FAMILY HISTORY:  Her mother had diabetes.  SOCIAL HISTORY: She  reports that she has never smoked. She has never used smokeless tobacco. She reports that she does not drink alcohol or use drugs.  REVIEW OF SYSTEMS:   Unable to obtain.  SUBJECTIVE:  Tolerating BiPAP well.  Denies SOB, chest pain.  VITAL SIGNS: BP 97/62 (BP  Location: Right Arm)   Pulse 68   Temp 98 F (36.7 C) (Axillary)   Resp (!) 22   Ht  (1.651 m)   Wt 110.1 kg (242 lb 11.6 oz)   SpO2 98%   BMI 40.39 kg/m   HEMODYNAMICS:    VENTILATOR SETTINGS:    INTAKE / OUTPUT: I/O last 3 completed shifts: In: 1450 [I.V.:1050; IV Piggyback:400] Out: 350 [Urine:350]  PHYSICAL EXAMINATION: General:  Elderly female, in NAD. Neuro:  Awake, able to answer basic questions appropriately. Left sided hemiparesis (chronic). HEENT:  [place]/AT. PERRL. MMM. Cardiovascular:  RRR, no M/R/G. Lungs:  Respirations  unlabored. Coarse bilaterally.  BiPAP in place. Abdomen:  BS x 4.  Soft, NT/ND. Musculoskeletal:  No deformities, no edema. Skin:  Intact, warm.  LABS:  BMET  Recent Labs Lab 11/17/15 1828 11/18/15 0513  NA 141 141  K 4.2 4.9  CL 101 103  CO2 32 33*  BUN 15 15  CREATININE 0.73 0.52  GLUCOSE 146* 209*    Electrolytes  Recent Labs Lab 11/17/15 1828 11/18/15 0513  CALCIUM 9.2 8.7*    CBC  Recent Labs Lab 11/17/15 1828 11/18/15 0513  WBC 7.4 4.8  HGB 11.7* 11.5*  HCT 40.0 38.2  PLT 220 228    Coag's No results for input(s): APTT, INR in the last 168 hours.  Sepsis Markers  Recent Labs Lab 11/17/15 1828 11/17/15 1838 11/17/15 2013 11/18/15 0705  LATICACIDVEN 3.1*  --  2.3* 1.1  PROCALCITON  --  <0.10  --   --     ABG  Recent Labs Lab 11/19/15 1810  PHART 7.252*  PCO2ART 88.7*  PO2ART 64.6*    Liver Enzymes  Recent Labs Lab 11/18/15 0513  AST 21  ALT 15  ALKPHOS 33*  BILITOT 1.1  ALBUMIN 3.6    Cardiac Enzymes  Recent Labs Lab 11/18/15 0705 11/19/15 1059  TROPONINI 0.03* 0.04*    Glucose  Recent Labs Lab 11/18/15 1205 11/18/15 1626 11/18/15 2144 11/19/15 0803 11/19/15 1205 11/19/15 1640  GLUCAP 244* 197* 181* 143* 210* 126*    Imaging Ct Angio Chest Pe W Or Wo Contrast  Result Date: 11/19/2015 CLINICAL DATA:  Chest pain with cough ; shortness of breath EXAM: CT ANGIOGRAPHY CHEST WITH CONTRAST TECHNIQUE: Multidetector CT imaging of the chest was performed using the standard protocol during bolus administration of intravenous contrast. Multiplanar CT image reconstructions and MIPs were obtained to evaluate the vascular anatomy. CONTRAST:  100 mL Isovue 370 nonionic COMPARISON:  Chest radiograph November 17, 2015 FINDINGS: Cardiovascular: There is no demonstrable pulmonary embolus. There is no thoracic aortic aneurysm or dissection. There are scattered foci of calcification in the thoracic aorta. The visualized great vessels  appear unremarkable. Heart is enlarged. Pericardium is not appreciably thickened. Mediastinum/Nodes: There is a benign-appearing calcification along the anterior aspect of the right lobe of the thyroid measuring 7 x 5 mm. Thyroid appears inhomogeneous and attenuation, likely due to multinodular goiter. There is no appreciable thoracic adenopathy. Lungs/Pleura: There is a sizable right pleural effusion with a much smaller left pleural effusion. There is interstitial edema lower lobes. There is airspace consolidation in the right lower lobe and medial segment right middle lobe. There is patchy atelectasis in the left upper lobe. There is a focal area of consolidation in the lingula. Upper Abdomen: Visualized upper abdominal structures are unremarkable. Musculoskeletal: There is degenerative change in the thoracic spine and in each shoulder. There are no blastic or lytic bone lesions. Review  of the MIP images confirms the above findings. IMPRESSION: No demonstrable pulmonary embolus. There is cardiomegaly with bilateral pleural effusions, larger on the right than on the left, we underlying interstitial edema. These findings are felt to represent a degree of congestive heart failure. Areas of airspace consolidation in the right lower lobe, right middle lobe, and lingula, felt to represent superimposed multifocal pneumonia. No adenopathy. Multinodular goiter. Electronically Signed   By: Bretta Bang III M.D.   On: 11/19/2015 11:14  Dg Chest Port 1 View  Result Date: 11/19/2015 CLINICAL DATA:  Dyspnea EXAM: PORTABLE CHEST 1 VIEW COMPARISON:  CTA chest dated 11/19/2015 FINDINGS: Cardiomegaly with pulmonary vascular congestion and possible mild interstitial edema. Small bilateral pleural effusions, right greater than left. No pneumothorax. IMPRESSION: Cardiomegaly with pulmonary vascular congestion and possible mild interstitial edema. Small bilateral pleural effusions, right greater than left. Electronically  Signed   By: Charline Bills M.D.   On: 11/19/2015 18:32    STUDIES:  7/26 CT angio chest >> Rt pleural effusion, interstitial edema, ASD RLL and RML, goiter 7/26 Echo >> mild LVH, EF 55 to 60%, grade 1 diastolic CHF, mod RV dilation, severe RA dilation  CULTURES: 7/24 Blood >>  7/26 Sputum >>   ANTIBIOTICS: 7/24 Vancomycin >> 7/26 7/24 Aztreonam >> 7/26 Clindamycin >>   SIGNIFICANT EVENTS: 7/24 Admit 7/26 To ICU for BiPAP  LINES/TUBES:  DISCUSSION: 82 yo female from NH with HCAP.  Transferred to ICU with acute on chronic hypoxic/hypercapnic respiratory failure.  ASSESSMENT / PLAN:  PULMONARY A: Acute on chronic hypoxic/hypercapnic respiratory failure from HCAP, pleural effusion, OSA/OHS, and meds. P:   Continue BiPAP. F/u ABG. Oxygen to keep SpO2 88 to 95%.  CARDIOVASCULAR A:  Mild troponin elevation >> likely demand ischemia from hypoxia. Hx of HTN, HLD. P:  Monitor hemodynamics. Hold norvasc, asa, lipitor, catapres, lisinopril, toprol while NPO.  RENAL A:   No acute issues. P:   Monitor renal fx, urine outpt.  GASTROINTESTINAL A:   Dysphagia. P:   NPO until mental status improved. F/u with speech therapy.  HEMATOLOGIC A:   No acute issues. P:  F/u CBC. Lovenox for DVT prevention.  INFECTIOUS A:   HCAP >> has PCN allergy. P:   Aztreonam, clindamycin. Follow cultures.  ENDOCRINE A:   Hx of DM.   P:   SSI.  NEUROLOGIC A:   Acute metabolic encephalopathy. Hx of CVA with Lt hemiparesis, DM neuropathy. P:   Monitor mental status. Hold cymbalta, neurotin, lyrica, ambien until mental status improved.   FAMILY  - Updates:  None available.  - Inter-disciplinary family meet or Palliative Care meeting due by:  08/01.   CC time: 30 minutes.  Rutherford Guys, [place] [facility] Pager: [phone]  or [phone] 11/19/2015, 8:15 PM  Attending note: I have seen and examined the patient with  nurse practitioner/resident and agree with the note. History, labs and imaging reviewed.  82 Y/O with PMH of CVA, DM, OSA, OHS admitted from NH with HCAP. Transferred to SDU for hypercarbic resp failure. Started on Bipap. ABG reviewed > improved Pco2 from 89 to 74  BP 105/55, HR 56, RR 18, 98% sats Pt is alert and oriented. CVS-RRR RS- Diminished air entry, No wheeze or crackles. Abd- Soft, + BS Ext- trace edema  Labs and imaging reviewed.  Assessment/Plan HCAP Hypercarbic resp failure Elevated troponins. Maybe demand ischemia CHF  - Continue Bipap. Recheck ABG in AM -  On broad abx coverage with clinidamycin and aztreonam - Follow cultures and procalcitonin. - Start lasix for diuresis  - Follow troponins.  Rest of plan as above. Critical care time- 35 mins. This represents my independent time taking care of the patient.   Chilton Greathouse MD [facility] Pager [phone] If no answer or after 3pm call: [phone] 11/20/2015, 3:37 AM

## 2015-11-19 NOTE — Progress Notes (Signed)
Order for MBS received, will plan for next date given findings of CT chest and concern for aspiration.  Thanks. Donavan Burnet, MS [facility] SLP [phone], [phone]

## 2015-11-19 NOTE — Progress Notes (Signed)
Nutrition Brief Note  Patient identified on the Malnutrition Screening Tool (MST) Report  Wt Readings from Last 15 Encounters:  11/19/15 242 lb 11.6 oz (110.1 kg)    Body mass index is 40.39 kg/m. Patient meets criteria for morbid obesity based on current BMI.   Current diet order is Heart Healthy/Carb Modified, patient consumed 75% of breakfast this AM. She reports good appetite now and PTA and denies any chewing or swallowing difficulties. Pt reports she was at a facility PTA and did not always receive the foods she desired but would eat as much as she was able of what was served to her.   Pt states that in the past 2 months she has intentionally lost 40 lbs. Based on CBW, this would indicate 14% body weight in this time frame which is significant. Pt reports that she was also losing fluid weight PTA and that she was very happy about this. She states she wanted to lose weight to make breathing and walking easier.   SLP saw pt yesterday and also earlier today and recommendation this AM for Dysphagia 3, thin liquids. Ensure Shiela Mayer is currently ordered BID. Will decrease order to once/day (350 kcal, 20 grams of protein) d/t pt's desire to lose weight.   Labs and medications reviewed. No nutrition interventions warranted at this time. If nutrition issues arise, please consult RD.     Trenton Gammon, MS, RD, LDN Inpatient Clinical Dietitian Pager # [phone] After hours/weekend pager # [phone]

## 2015-11-19 NOTE — Progress Notes (Signed)
Patient has suddenly become increasingly lethargic. Alert to voice but quickly drifts back to sleep- unable to stay alert. VSS but BP soft 97/62. Respirations labored- O2 Sat stable on 4L/[place]. MD Gherghe aware. Ordered ABGs- MD to come assess patient.

## 2015-11-19 NOTE — Progress Notes (Signed)
Speech Language Pathology Treatment: Dysphagia  Patient Details Name: Sara Schmidt MRN: [id number] DOB: Apr 07, 1931 Today's Date: 11/19/2015 Time: 8250-0370 SLP Time Calculation (min) (ACUTE ONLY): 40 min  Assessment / Plan / Recommendation Clinical Impression  Today pt appears more dyspneic and presenting with congested coughing/breathing at baseline. Cues to cough/clear throat and expectorate effective to clear gurgly breathing quality prior to po.  Pt given oral suction to use to clear secretions.   SLP assisted pt with set up for breakfast meal.   Observed pt consuming coffee, orange juice, bagel, magic cup and oatmeal.  Prolonged mastication noted with anterior labial loss left side due to facial/trigeminal nerve involvement.  Cues to clear effective to decrease oral residuals.   Pt admits to deficits in the am stating she senses "something on her left neck" that blocks food from clearing. She states this clears after breakfast - SlP questions secretion retention? And/or reflux issues.    Coughing observed with large boluses of thin via straw- concerning for airway infiltration.  Verbal cues to take small bites/sips effective, however uncertain if pt will comply. Pt states "I gulp all the time".  SLP implored to pt to follow aspiration precautions strictly due to respiratory issues and h/o cva. Using teach back, education ongoing.     Will follow up given today's presentation.  Pt may benefit from MBS and/or esophagram if she does not improve.  RN informed of concerns and recommendations.   Note pt for CT chest today per Md.     HPI HPI: 82 yo female adm to [facility] with tacypnea, cough, SOB x1 week prior to admit.  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  CXR showed airspace consolidation vs asymmetric edema.  Swallow eval ordered.        SLP Plan  Continue with current plan of care     Recommendations  Diet recommendations: Dysphagia 3 (mechanical soft);Thin  liquid Liquids provided via: Straw;Cup Medication Administration: Whole meds with puree Supervision: Patient able to self feed (set up assistance) Compensations: Slow rate;Small sips/bites;Lingual sweep for clearance of pocketing Postural Changes and/or Swallow Maneuvers: Out of bed for meals;Seated upright 90 degrees;Upright 30-60 min after meal             Oral Care Recommendations: Oral care BID Follow up Recommendations: Skilled Nursing facility Plan: Continue with current plan of care     GO                Donavan Burnet, MS [facility] SLP [phone]

## 2015-11-20 ENCOUNTER — Inpatient Hospital Stay (HOSPITAL_COMMUNITY): Payer: Medicare Other

## 2015-11-20 DIAGNOSIS — J9601 Acute respiratory failure with hypoxia: Secondary | ICD-10-CM

## 2015-11-20 LAB — CBC
HCT: 35 % — ABNORMAL LOW (ref 36.0–46.0)
Hemoglobin: 10.5 g/dL — ABNORMAL LOW (ref 12.0–15.0)
MCH: 26.1 pg (ref 26.0–34.0)
MCHC: 30 g/dL (ref 30.0–36.0)
MCV: 86.8 fL (ref 78.0–100.0)
PLATELETS: 172 10*3/uL (ref 150–400)
RBC: 4.03 MIL/uL (ref 3.87–5.11)
RDW: 17.2 % — AB (ref 11.5–15.5)
WBC: 4.5 10*3/uL (ref 4.0–10.5)

## 2015-11-20 LAB — BASIC METABOLIC PANEL
Anion gap: 7 (ref 5–15)
BUN: 17 mg/dL (ref 6–20)
CALCIUM: 8.5 mg/dL — AB (ref 8.9–10.3)
CO2: 33 mmol/L — ABNORMAL HIGH (ref 22–32)
Chloride: 102 mmol/L (ref 101–111)
Creatinine, Ser: 0.81 mg/dL (ref 0.44–1.00)
GFR calc Af Amer: >60 mL/min (ref >60)
GLUCOSE: 84 mg/dL (ref 65–99)
POTASSIUM: 4.6 mmol/L (ref 3.5–5.1)
SODIUM: 142 mmol/L (ref 135–145)

## 2015-11-20 LAB — LEGIONELLA PNEUMOPHILA SEROGP 1 UR AG: L. PNEUMOPHILA SEROGP 1 UR AG: NEGATIVE

## 2015-11-20 LAB — GLUCOSE, CAPILLARY
GLUCOSE-CAPILLARY: 166 mg/dL — AB (ref 65–99)
GLUCOSE-CAPILLARY: 183 mg/dL — AB (ref 65–99)
GLUCOSE-CAPILLARY: 75 mg/dL (ref 65–99)
GLUCOSE-CAPILLARY: 79 mg/dL (ref 65–99)
Glucose-Capillary: 89 mg/dL (ref 65–99)
Glucose-Capillary: 142 mg/dL — ABNORMAL HIGH (ref 65–99)

## 2015-11-20 LAB — PROCALCITONIN

## 2015-11-20 MED ORDER — FUROSEMIDE 10 MG/ML IJ SOLN: 40.0000 mg | Freq: Once | INTRAMUSCULAR | Status: DC

## 2015-11-20 MED ORDER — FUROSEMIDE 10 MG/ML IJ SOLN
20.0000 mg | Freq: Once | INTRAMUSCULAR | Status: AC
  Administered 2015-11-20: 20 mg via INTRAVENOUS
  Filled 2015-11-20: qty 2

## 2015-11-20 NOTE — Progress Notes (Signed)
PT remains off BiPAP. PT states she is breathing fine at this time. Remains on 2lpm [place]- utilizes at facility. RN aware.

## 2015-11-20 NOTE — Progress Notes (Signed)
Pharmacy Antibiotic Note  Sara Schmidt is a 82 y.o. female admitted on 11/17/2015 with SOB and worsening dyspnea.  Pharmacy has been consulted for Aztreonam dosing for possible PNA vs bronchitis. Noted allergy to ampicillin (SOB).  Plan: Continue Aztreonam 1g IV q8h F/u renal function, cultures, clinical course   Temp (24hrs), Avg:97.7 F (36.5 C), Min:97.1 F (36.2 C), Max:98.5 F (36.9 C)   Recent Labs Lab 11/17/15 1828 11/17/15 2013 11/18/15 0513 11/18/15 0705 11/20/15 0310  WBC 7.4  --  4.8  --  4.5  CREATININE 0.73  --  0.52  --  0.81  LATICACIDVEN 3.1* 2.3*  --  1.1  --     Estimated Creatinine Clearance: 62.2 mL/min (by C-G formula based on SCr of 0.81 mg/dL).    Allergies  Allergen Reactions  . Ampicillin Shortness Of Breath    Reaction:  Unknown  Has patient had a PCN reaction causing immediate rash, facial/tongue/throat swelling, SOB or lightheadedness with hypotension:  Unsure Has patient had a PCN reaction causing severe rash involving mucus membranes or skin necrosis: Unsure Has patient had a PCN reaction that required hospitalization Unsure Has patient had a PCN reaction occurring within the last 10 years: Unsure If all of the above answers are "NO", then may proceed with Cephalosporin use.    Antimicrobials this admission: 7/24 Vancomycin>>7/26 7/24 Aztreonam>> 7/24 Cefepime x1 7/26 Clindamycin >>  Dose adjustments this admission:   Microbiology results: 7/24 BCx: NGTD 7/25 MRSA PCR: negative 7/26 Sputum: sent 7/26 MRSA PCR: negative    Thank you for allowing pharmacy to be a part of this patient's care.  Lynann Beaver PharmD, BCPS Pager [phone] 11/20/2015 8:41 AM

## 2015-11-20 NOTE — Progress Notes (Signed)
PT demonstrated verbal and hands on understanding of Flutter device. 

## 2015-11-20 NOTE — Progress Notes (Signed)
Placed patient on BIPAP for the night with IPAP=16 and EPAP=6cm

## 2015-11-20 NOTE — Progress Notes (Signed)
PT remains off BiPAP and on 2 lpm [place]- PT states she is breathing fine at this time. RN aware.

## 2015-11-20 NOTE — Progress Notes (Signed)
PULMONARY / CRITICAL CARE MEDICINE   Name: Sara Schmidt MRN: [id number] DOB: [birth year]    ADMISSION DATE:  11/17/2015 CONSULTATION DATE:  11/19/2015  REFERRING MD:  Dr. Lafe Garin, [facility]  CHIEF COMPLAINT:  Short of breath  BRIEF SUMMARY: 82 y/o female from [facility] admitted with dyspnea, productive cough ([place] phlegm), and hypoxia.  Symptoms had been present for roughly 1 week. She had associated left sided pleuritic chest pain. She has hx of CVA with Lt hemiparesis with concern for dysphagia and aspiration.  Rx with Abx for pneumonia, given fluid boluses, and was seen by speech therapy.  7/26 pm, she became more somnolent and somewhat lethargic.  ABG showed acute on chronic hypercapnia.  She was transferred to ICU and started on BiPAP and PCCM was called for further recs.    SUBJECTIVE:  Pt reports feeling tired this am.  Wore bipap from 10pm-6 am. Asking for a piece of chocolate  VITAL SIGNS: BP 125/68   Pulse 64   Temp 98.1 F (36.7 C) (Axillary)   Resp 20   Ht  (1.575 m)   Wt 246 lb 14.6 oz (112 kg)   SpO2 97%   BMI 45.16 kg/m   HEMODYNAMICS:    VENTILATOR SETTINGS: FiO2 (%):  [30 %] 30 %  INTAKE / OUTPUT: I/O last 3 completed shifts: In: 1160 [P.O.:360; IV Piggyback:800] Out: 350 [Urine:350]  PHYSICAL EXAMINATION: General:  Elderly female, in NAD. Neuro:  Awakens to voice, oriented, able to answer basic questions appropriately. Left sided hemiparesis (chronic). HEENT:  [place]/AT. PERRL. MMM. Cardiovascular:  RRR, no M/R/G Lungs:  Even/non-labored on 2L O2, lungs bilaterally coarse Abdomen:  BS x 4.  Soft, NT/ND Musculoskeletal:  No deformities, no edema. Skin:  Intact, warm.  LABS:  BMET  Recent Labs Lab 11/17/15 1828 11/18/15 0513 11/20/15 0310  NA 141 141 142  K 4.2 4.9 4.6  CL 101 103 102  CO2 32 33* 33*  BUN CREATININE 0.73 0.52 0.81  GLUCOSE 146* 209* 84    Electrolytes  Recent Labs Lab 11/17/15 1828 11/18/15 0513  11/20/15 0310  CALCIUM 9.2 8.7* 8.5*    CBC  Recent Labs Lab 11/17/15 1828 11/18/15 0513 11/20/15 0310  WBC 7.4 4.8 4.5  HGB 11.7* 11.5* 10.5*  HCT 40.0 38.2 35.0*  PLT 220 228 172    Coag's No results for input(s): APTT, INR in the last 168 hours.  Sepsis Markers  Recent Labs Lab 11/17/15 1828 11/17/15 1838 11/17/15 2013 11/18/15 0705 11/20/15 0640  LATICACIDVEN 3.1*  --  2.3* 1.1  --   PROCALCITON  --  <0.10  --   --  <0.10    ABG  Recent Labs Lab 11/19/15 1810 11/19/15 2116  PHART 7.252* 7.314*  PCO2ART 88.7* 74.4*  PO2ART 64.6* 68.5*    Liver Enzymes  Recent Labs Lab 11/18/15 0513  AST 21  ALT 15  ALKPHOS 33*  BILITOT 1.1  ALBUMIN 3.6    Cardiac Enzymes  Recent Labs Lab 11/18/15 0705 11/19/15 1059  TROPONINI 0.03* 0.04*    Glucose  Recent Labs Lab 11/19/15 1205 11/19/15 1640 11/19/15 2004 11/19/15 2352 11/20/15 0346 11/20/15 0741  GLUCAP 210* 126* 97 75 79 89    Imaging Ct Angio Chest Pe W Or Wo Contrast  Result Date: 11/19/2015 CLINICAL DATA:  Chest pain with cough ; shortness of breath EXAM: CT ANGIOGRAPHY CHEST WITH CONTRAST TECHNIQUE: Multidetector CT imaging of the chest was performed using the standard  protocol during bolus administration of intravenous contrast. Multiplanar CT image reconstructions and MIPs were obtained to evaluate the vascular anatomy. CONTRAST:  100 mL Isovue 370 nonionic COMPARISON:  Chest radiograph November 17, 2015 FINDINGS: Cardiovascular: There is no demonstrable pulmonary embolus. There is no thoracic aortic aneurysm or dissection. There are scattered foci of calcification in the thoracic aorta. The visualized great vessels appear unremarkable. Heart is enlarged. Pericardium is not appreciably thickened. Mediastinum/Nodes: There is a benign-appearing calcification along the anterior aspect of the right lobe of the thyroid measuring 7 x 5 mm. Thyroid appears inhomogeneous and attenuation, likely due to  multinodular goiter. There is no appreciable thoracic adenopathy. Lungs/Pleura: There is a sizable right pleural effusion with a much smaller left pleural effusion. There is interstitial edema lower lobes. There is airspace consolidation in the right lower lobe and medial segment right middle lobe. There is patchy atelectasis in the left upper lobe. There is a focal area of consolidation in the lingula. Upper Abdomen: Visualized upper abdominal structures are unremarkable. Musculoskeletal: There is degenerative change in the thoracic spine and in each shoulder. There are no blastic or lytic bone lesions. Review of the MIP images confirms the above findings. IMPRESSION: No demonstrable pulmonary embolus. There is cardiomegaly with bilateral pleural effusions, larger on the right than on the left, we underlying interstitial edema. These findings are felt to represent a degree of congestive heart failure. Areas of airspace consolidation in the right lower lobe, right middle lobe, and lingula, felt to represent superimposed multifocal pneumonia. No adenopathy. Multinodular goiter. Electronically Signed   By: Bretta Bang III M.D.   On: 11/19/2015 11:14  Dg Chest Port 1 View  Result Date: 11/20/2015 CLINICAL DATA:  Respiratory failure. EXAM: PORTABLE CHEST 1 VIEW COMPARISON:  11/19/2015.  CT 11/19/2015 . FINDINGS: Cardiomegaly with bilateral pulmonary alveolar infiltrates and bilateral pleural effusions. Slight worsening from prior exam. Findings consistent congestive heart failure. Bilateral pneumonia cannot be excluded. No pneumothorax. IMPRESSION: Persistent cardiomegaly with bilateral pulmonary infiltrates and pleural effusions. Slight worsening from prior exam. Findings consistent congestive heart failure . Bilateral pneumonia cannot be excluded . Electronically Signed   By: Maisie Fus  Register   On: 11/20/2015 06:56  Dg Chest Port 1 View  Result Date: 11/19/2015 CLINICAL DATA:  Dyspnea EXAM: PORTABLE  CHEST 1 VIEW COMPARISON:  CTA chest dated 11/19/2015 FINDINGS: Cardiomegaly with pulmonary vascular congestion and possible mild interstitial edema. Small bilateral pleural effusions, right greater than left. No pneumothorax. IMPRESSION: Cardiomegaly with pulmonary vascular congestion and possible mild interstitial edema. Small bilateral pleural effusions, right greater than left. Electronically Signed   By: Charline Bills M.D.   On: 11/19/2015 18:32    STUDIES:  7/26 CT angio chest >> Rt pleural effusion, interstitial edema, ASD RLL and RML, goiter 7/26 Echo >> mild LVH, EF 55 to 60%, grade 1 diastolic CHF, mod RV dilation, severe RA dilation  CULTURES: 7/24 Blood >>  7/26 Sputum >>   ANTIBIOTICS: 7/24 Vancomycin >> 7/26 7/24 Aztreonam >> 7/26 Clindamycin >>   SIGNIFICANT EVENTS: 7/24 Admit 7/26 To ICU for BiPAP  LINES/TUBES:  DISCUSSION: 82 yo female from NH with HCAP.  Transferred to ICU with acute on chronic hypoxic/hypercapnic respiratory failure.  Resolved 7/27 am after use of bipap.  ASSESSMENT / PLAN:  PULMONARY A: Acute on chronic hypoxic/hypercapnic respiratory failure from RLL HCAP, pleural effusions, OSA/OHS, and meds. OSA on CPAP P:   Nocturnal BiPAP QHS Intermittent CXR  Oxygen to keep SpO2 88 to 95% See ID  Add flutter valve  Lasix as below May need repeat sleep study to evaluate pressure needs  CARDIOVASCULAR A:  Mild troponin elevation >> likely demand ischemia from hypoxia. Concern for CHF on CXR Hx of HTN, HLD. P:  Monitor hemodynamics. Hold norvasc, asa, lipitor, catapres, lisinopril, toprol while NPO.  Defer restart to primary MD  RENAL A:   At Risk AKI - with active diuresis P:   Monitor renal fx, urine outpt. Lasix as renal function / BP permit Incontinence makes it difficult to monitor I/O's  GASTROINTESTINAL A:   Dysphagia. P:   D3 Diet  SLP following  Monitor closely for diarrhea with clinda  HEMATOLOGIC A:   No acute  issues. P:  F/u CBC. Lovenox for DVT prevention.  INFECTIOUS A:   HCAP >> has PCN allergy (anaphylaxis), SNF resident P:   Aztreonam, clindamycin. Follow cultures. Trend PCT >> negative thus far Narrow abx as able  ENDOCRINE A:   Hx of DM.   P:   SSI  NEUROLOGIC A:   Acute metabolic encephalopathy - resolved after bipap Hx of CVA with Lt hemiparesis, DM neuropathy. P:   Monitor mental status. Hold cymbalta, neurotin, lyrica, ambien until mental status improved Will return to SNF post discharge   FAMILY  - Updates:  None available 7/27 am  - Inter-disciplinary family meet or Palliative Care meeting due by:  08/01.   Canary Brim, NP-C [facility] Pgr: [phone] or if no answer [phone] 11/20/2015, 10:17 AM

## 2015-11-20 NOTE — Progress Notes (Signed)
MBSS complete. Full report located under chart review in imaging section. Pt demonstrates no aspiration or penetration, normal pharyngeal swallow. Mild oral residue due to prior CVA. Pt may continue dys 3 /thin liquids. Harlon Ditty, MA CCC-SLP [phone]

## 2015-11-20 NOTE — Progress Notes (Signed)
PROGRESS NOTE  Sara Schmidt BJY:[id number] DOB: 28-Oct-1930 DOA: 11/17/2015 PCP: Katy Apo, MD   LOS: 3 days   Brief Narrative: Sara Schmidt is an 82 y.o. female past medical history stroke with left-sided hemiparesis, essential hypertension, diabetes mellitus hyperlipidemia that comes in with productive cough and shortness of breath.  Assessment & Plan: Principal Problem:   HCAP (healthcare-associated pneumonia) Active Problems:   Essential hypertension   Diabetes mellitus type 2, controlled (HCC)   OSA on CPAP   Pneumonia   COPD exacerbation (HCC)   Dyspnea   Hypoxia   Acute respiratory failure with hypoxia (HCC)   Acute on chronic hypercarbic respiratory failure with respiratory acidosis - improved on BiPAP, appreciate pulm consult - avoid sedating agents  HCAP (healthcare-associated pneumonia) - component of aspiration as well - MRSA negative, discontinue Vancomycin - add clindamycin for anaerobic coverage - Sputum cultures and urine studies are pending. - Strep pneumo antigen negative - will narrow Abx in 24 h if stable  Mild elevation in cardiac biomarkers - EKG shows a right axis deviation right bundle branch block with nonspecific T-wave changes. - fluid overload on admssion CXR - troponin flat, now c/w ACS but rather demand. Echo with normal EF, grade 1 DD  Acute on chronic CHFpEF - s/p Lasix x 3 on 7/26 - hold further Lasix today, monitor renal function  Acute encephalopathy - in the setting of hypercarbia  Essential hypertension - Continue lisinopril metoprolol clonidine and Norvasc. Resume Lasix  Diabetes mellitus type 2, controlled (HCC) - CBGs controlled.  - Continue long-acting insulin and resistent sliding scale  OSA on CPAP - On C-pap.  Multifocal atrial tachycardia - Probably due to healthcare associated pneumonia we'll continue to monitor on telemetry her heart rate has been ranging 100-106.  Morbidly obese  History of CVA -  With residual left-sided weakness, slurred speech - Currently residing in an SNF   DVT prophylaxis: Lovenox Code Status: Full code Family Communication: No family at bedside Disposition Plan: SNF when stable  Consultants:   PCCM  Procedures:   2D echo  Antimicrobials:  Vancomycin 7/24 >> 7/26  Aztreonam 7/24 >>  Clindamycin 7/26 >>   Subjective: - much more alert this morning, no dyspnea, tolerating BiPAP  Objective: Vitals:   11/20/15 0700 11/20/15 0800 11/20/15 0841 11/20/15 0900  BP:  125/68    Pulse: 73 (!) 59  64  Resp: Temp:  98.1 F (36.7 C)    TempSrc:  Axillary    SpO2: 96% 95% 97% 97%  Weight:      Height:        Intake/Output Summary (Last 24 hours) at 11/20/15 1048 Last data filed at 11/20/15 0509  Gross per 24 hour  Intake              520 ml  Output                0 ml  Net              520 ml   Filed Weights   11/17/15 2215 11/19/15 0504 11/19/15 1856  Weight: 109.3 kg (240 lb 15.4 oz) 110.1 kg (242 lb 11.6 oz) 112 kg (246 lb 14.6 oz)    Examination: Constitutional: NAD Vitals:   11/20/15 0700 11/20/15 0800 11/20/15 0841 11/20/15 0900  BP:  125/68    Pulse: 73 (!) 59  64  Resp: Temp:  98.1 F (36.7 C)  TempSrc:  Axillary    SpO2: 96% 95% 97% 97%  Weight:      Height:       Eyes: PERRL ENMT: Mucous membranes are moist. Neck: normal, supple, no masses, no thyromegaly Respiratory: no wheezing, no crackles. BiPAP on Cardiovascular: Regular rate and rhythm, no murmurs / rubs / gallops.  Abdomen: no tenderness. Bowel sounds positive.  Neurologic: left sided weakness   Data Reviewed: I have personally reviewed following labs and imaging studies  CBC:  Recent Labs Lab 11/17/15 1828 11/18/15 0513 11/20/15 0310  WBC 7.4 4.8 4.5  NEUTROABS 5.0 4.1  --   HGB 11.7* 11.5* 10.5*  HCT 40.0 38.2 35.0*  MCV 89.5 86.6 86.8  PLT 220 228 172   Basic Metabolic Panel:  Recent Labs Lab 11/17/15 1828  11/18/15 0513 11/20/15 0310  NA 141 141 142  K 4.2 4.9 4.6  CL 101 103 102  CO2 32 33* 33*  GLUCOSE 146* 209* 84  BUN 15 15 17   CREATININE 0.73 0.52 0.81  CALCIUM 9.2 8.7* 8.5*   GFR: Estimated Creatinine Clearance: 62.2 mL/min (by C-G formula based on SCr of 0.81 mg/dL). Liver Function Tests:  Recent Labs Lab 11/18/15 0513  AST 21  ALT 15  ALKPHOS 33*  BILITOT 1.1  PROT 6.8  ALBUMIN 3.6   No results for input(s): LIPASE, AMYLASE in the last 168 hours. No results for input(s): AMMONIA in the last 168 hours. Coagulation Profile: No results for input(s): INR, PROTIME in the last 168 hours. Cardiac Enzymes:  Recent Labs Lab 11/18/15 0705 11/19/15 1059  TROPONINI 0.03* 0.04*   BNP (last 3 results) No results for input(s): PROBNP in the last 8760 hours. HbA1C:  Recent Labs  11/18/15 0513  HGBA1C 6.4*   CBG:  Recent Labs Lab 11/19/15 1640 11/19/15 2004 11/19/15 2352 11/20/15 0346 11/20/15 0741  GLUCAP 126* 97 75 79 89   Lipid Profile: No results for input(s): CHOL, HDL, LDLCALC, TRIG, CHOLHDL, LDLDIRECT in the last 72 hours. Thyroid Function Tests: No results for input(s): TSH, T4TOTAL, FREET4, T3FREE, THYROIDAB in the last 72 hours. Anemia Panel: No results for input(s): VITAMINB12, FOLATE, FERRITIN, TIBC, IRON, RETICCTPCT in the last 72 hours. Urine analysis: No results found for: COLORURINE, APPEARANCEUR, LABSPEC, PHURINE, GLUCOSEU, HGBUR, BILIRUBINUR, KETONESUR, PROTEINUR, UROBILINOGEN, NITRITE, LEUKOCYTESUR Sepsis Labs: Invalid input(s): PROCALCITONIN, LACTICIDVEN  Recent Results (from the past 240 hour(s))  Culture, blood (routine x 2)     Status: None (Preliminary result)   Collection Time: 11/17/15  6:29 PM  Result Value Ref Range Status   Specimen Description BLOOD RIGHT HAND  Final   Special Requests BOTTLES DRAWN AEROBIC AND ANAEROBIC 5CC  Final   Culture   Final    NO GROWTH 2 DAYS Performed at [facility]    Report Status  PENDING  Incomplete  Culture, blood (routine x 2)     Status: None (Preliminary result)   Collection Time: 11/17/15  6:34 PM  Result Value Ref Range Status   Specimen Description BLOOD LEFT HAND  Final   Special Requests BOTTLES DRAWN AEROBIC AND ANAEROBIC 5CC  Final   Culture   Final    NO GROWTH 2 DAYS Performed at [facility]    Report Status PENDING  Incomplete  MRSA PCR Screening     Status: None   Collection Time: 11/18/15  4:15 PM  Result Value Ref Range Status   MRSA by PCR NEGATIVE NEGATIVE Final    Comment:  The GeneXpert MRSA Assay (FDA approved for NASAL specimens only), is one component of a comprehensive MRSA colonization surveillance program. It is not intended to diagnose MRSA infection nor to guide or monitor treatment for MRSA infections.   Culture, sputum-assessment     Status: None   Collection Time: 11/19/15  4:17 PM  Result Value Ref Range Status   Specimen Description SPUTUM  Final   Special Requests NONE  Final   Sputum evaluation   Final    THIS SPECIMEN IS ACCEPTABLE. RESPIRATORY CULTURE REPORT TO FOLLOW.   Report Status 11/19/2015 FINAL  Final  Culture, respiratory (NON-Expectorated)     Status: None (Preliminary result)   Collection Time: 11/19/15  4:17 PM  Result Value Ref Range Status   Specimen Description SPUTUM  Final   Special Requests NONE  Final   Gram Stain   Final    ABUNDANT WBC PRESENT,BOTH PMN AND MONONUCLEAR ABUNDANT YEAST FEW GRAM VARIABLE ROD RARE GRAM POSITIVE COCCI IN PAIRS FEW SQUAMOUS EPITHELIAL CELLS PRESENT Performed at [facility]    Culture PENDING  Incomplete   Report Status PENDING  Incomplete  MRSA PCR Screening     Status: None   Collection Time: 11/19/15  6:32 PM  Result Value Ref Range Status   MRSA by PCR NEGATIVE NEGATIVE Final    Comment:        The GeneXpert MRSA Assay (FDA approved for NASAL specimens only), is one component of a comprehensive MRSA colonization surveillance  program. It is not intended to diagnose MRSA infection nor to guide or monitor treatment for MRSA infections.       Radiology Studies: Ct Angio Chest Pe W Or Wo Contrast  Result Date: 11/19/2015 CLINICAL DATA:  Chest pain with cough ; shortness of breath EXAM: CT ANGIOGRAPHY CHEST WITH CONTRAST TECHNIQUE: Multidetector CT imaging of the chest was performed using the standard protocol during bolus administration of intravenous contrast. Multiplanar CT image reconstructions and MIPs were obtained to evaluate the vascular anatomy. CONTRAST:  100 mL Isovue 370 nonionic COMPARISON:  Chest radiograph November 17, 2015 FINDINGS: Cardiovascular: There is no demonstrable pulmonary embolus. There is no thoracic aortic aneurysm or dissection. There are scattered foci of calcification in the thoracic aorta. The visualized great vessels appear unremarkable. Heart is enlarged. Pericardium is not appreciably thickened. Mediastinum/Nodes: There is a benign-appearing calcification along the anterior aspect of the right lobe of the thyroid measuring 7 x 5 mm. Thyroid appears inhomogeneous and attenuation, likely due to multinodular goiter. There is no appreciable thoracic adenopathy. Lungs/Pleura: There is a sizable right pleural effusion with a much smaller left pleural effusion. There is interstitial edema lower lobes. There is airspace consolidation in the right lower lobe and medial segment right middle lobe. There is patchy atelectasis in the left upper lobe. There is a focal area of consolidation in the lingula. Upper Abdomen: Visualized upper abdominal structures are unremarkable. Musculoskeletal: There is degenerative change in the thoracic spine and in each shoulder. There are no blastic or lytic bone lesions. Review of the MIP images confirms the above findings. IMPRESSION: No demonstrable pulmonary embolus. There is cardiomegaly with bilateral pleural effusions, larger on the right than on the left, we underlying  interstitial edema. These findings are felt to represent a degree of congestive heart failure. Areas of airspace consolidation in the right lower lobe, right middle lobe, and lingula, felt to represent superimposed multifocal pneumonia. No adenopathy. Multinodular goiter. Electronically Signed   By: Bretta Bang III M.D.  On: 11/19/2015 11:14  Dg Chest Port 1 View  Result Date: 11/20/2015 CLINICAL DATA:  Respiratory failure. EXAM: PORTABLE CHEST 1 VIEW COMPARISON:  11/19/2015.  CT 11/19/2015 . FINDINGS: Cardiomegaly with bilateral pulmonary alveolar infiltrates and bilateral pleural effusions. Slight worsening from prior exam. Findings consistent congestive heart failure. Bilateral pneumonia cannot be excluded. No pneumothorax. IMPRESSION: Persistent cardiomegaly with bilateral pulmonary infiltrates and pleural effusions. Slight worsening from prior exam. Findings consistent congestive heart failure . Bilateral pneumonia cannot be excluded . Electronically Signed   By: Maisie Fus  Register   On: 11/20/2015 06:56  Dg Chest Port 1 View  Result Date: 11/19/2015 CLINICAL DATA:  Dyspnea EXAM: PORTABLE CHEST 1 VIEW COMPARISON:  CTA chest dated 11/19/2015 FINDINGS: Cardiomegaly with pulmonary vascular congestion and possible mild interstitial edema. Small bilateral pleural effusions, right greater than left. No pneumothorax. IMPRESSION: Cardiomegaly with pulmonary vascular congestion and possible mild interstitial edema. Small bilateral pleural effusions, right greater than left. Electronically Signed   By: Charline Bills M.D.   On: 11/19/2015 18:32    Scheduled Meds: . antiseptic oral rinse  7 mL Mouth Rinse q12n4p  . aztreonam  1 g Intravenous Q8H  . chlorhexidine  15 mL Mouth Rinse BID  . clindamycin (CLEOCIN) IV  600 mg Intravenous Q8H  . enoxaparin (LOVENOX) injection  40 mg Subcutaneous QHS  . famotidine (PEPCID) IV  20 mg Intravenous Q12H  . insulin aspart  0-20 Units Subcutaneous Q4H  .  polyvinyl alcohol  1 drop Both Eyes BID  . senna  2 tablet Oral QHS   Continuous Infusions:    Pamella Pert, MD, PhD [facility] Pager [phone]  If 7PM-7AM, please contact night-coverage [email or web address] Password [facility] 11/20/2015, 10:48 AM

## 2015-11-20 NOTE — Progress Notes (Signed)
RT removed PT from PAP at 0830 and placed on 4 lpm [place]. PT states she is breathing fine at this time.Current Sp02 on 4 lpm 100% (will titrate, does [place] 02 at facility), HR 64, RR 15, BP 125/68, BS clear to diminished. RN aware.

## 2015-11-20 NOTE — Progress Notes (Addendum)
Date:  November 20, 2015 Chart reviewed for concurrent status and case management needs. Will continue to follow the patient for changes and needs: patient on bipap overnight but is improved this am. On [place] o2 at 30% [address], [place], [place], [place]   [phone]

## 2015-11-21 LAB — CBC
HEMATOCRIT: 36.2 % (ref 36.0–46.0)
HEMOGLOBIN: 11.2 g/dL — AB (ref 12.0–15.0)
MCH: 26.5 pg (ref 26.0–34.0)
MCHC: 30.9 g/dL (ref 30.0–36.0)
MCV: 85.6 fL (ref 78.0–100.0)
Platelets: 160 10*3/uL (ref 150–400)
RBC: 4.23 MIL/uL (ref 3.87–5.11)
RDW: 17 % — AB (ref 11.5–15.5)
WBC: 4.7 10*3/uL (ref 4.0–10.5)

## 2015-11-21 LAB — GLUCOSE, CAPILLARY
GLUCOSE-CAPILLARY: 124 mg/dL — AB (ref 65–99)
GLUCOSE-CAPILLARY: 112 mg/dL — AB (ref 65–99)
GLUCOSE-CAPILLARY: 167 mg/dL — AB (ref 65–99)
Glucose-Capillary: 175 mg/dL — ABNORMAL HIGH (ref 65–99)
Glucose-Capillary: 112 mg/dL — ABNORMAL HIGH (ref 65–99)
Glucose-Capillary: 145 mg/dL — ABNORMAL HIGH (ref 65–99)

## 2015-11-21 LAB — BASIC METABOLIC PANEL
Anion gap: 5 (ref 5–15)
BUN: 14 mg/dL (ref 6–20)
CHLORIDE: 102 mmol/L (ref 101–111)
CO2: 35 mmol/L — ABNORMAL HIGH (ref 22–32)
Calcium: 8.6 mg/dL — ABNORMAL LOW (ref 8.9–10.3)
Creatinine, Ser: 0.72 mg/dL (ref 0.44–1.00)
GFR calc Af Amer: >60 mL/min (ref >60)
GFR calc non Af Amer: >60 mL/min (ref >60)
Glucose, Bld: 115 mg/dL — ABNORMAL HIGH (ref 65–99)
POTASSIUM: 4.3 mmol/L (ref 3.5–5.1)
SODIUM: 142 mmol/L (ref 135–145)

## 2015-11-21 LAB — PROCALCITONIN: Procalcitonin: <0.1 ng/mL

## 2015-11-21 MED ORDER — AMLODIPINE BESYLATE 5 MG PO TABS
5.0000 mg | ORAL_TABLET | Freq: Every day | ORAL | Status: DC
  Administered 2015-11-22: 5 mg via ORAL
  Filled 2015-11-21: qty 1

## 2015-11-21 MED ORDER — PANTOPRAZOLE SODIUM 40 MG PO TBEC
40.0000 mg | DELAYED_RELEASE_TABLET | Freq: Every day | ORAL | Status: DC
  Administered 2015-11-22: 40 mg via ORAL
  Filled 2015-11-21: qty 1

## 2015-11-21 MED ORDER — METOPROLOL TARTRATE 50 MG PO TABS: 50.0000 mg | ORAL_TABLET | Freq: Two times a day (BID) | ORAL | Status: DC

## 2015-11-21 MED ORDER — LEVOFLOXACIN 500 MG PO TABS
500.0000 mg | ORAL_TABLET | Freq: Every day | ORAL | Status: DC
  Administered 2015-11-21 – 2015-11-22 (×2): 500 mg via ORAL
  Filled 2015-11-21 (×2): qty 1

## 2015-11-21 MED ORDER — FUROSEMIDE 40 MG PO TABS
40.0000 mg | ORAL_TABLET | Freq: Every day | ORAL | Status: DC
  Administered 2015-11-21 – 2015-11-22 (×2): 40 mg via ORAL
  Filled 2015-11-21 (×2): qty 1

## 2015-11-21 MED ORDER — ATORVASTATIN CALCIUM 10 MG PO TABS
10.0000 mg | ORAL_TABLET | Freq: Every day | ORAL | Status: DC
  Administered 2015-11-21: 10 mg via ORAL
  Filled 2015-11-21: qty 1

## 2015-11-21 NOTE — Progress Notes (Signed)
CSW assisting with d/c planning. Sara Schmidt is able to admit pt this weekend if she is stable for d/c. Week end CSW will assist with d/c planning.  Cori Razor LCSW [phone]

## 2015-11-21 NOTE — Progress Notes (Addendum)
PROGRESS NOTE  Sara Schmidt ONG:[id number] DOB: [birth year] DOA: 11/17/2015 PCP: Katy Apo, MD   LOS: 4 days   Brief Narrative: Sara Schmidt is an 82 y.o. female past medical history stroke with left-sided hemiparesis, essential hypertension, diabetes mellitus hyperlipidemia that comes in with productive cough and shortness of breath.  Assessment & Plan: Principal Problem:   HCAP (healthcare-associated pneumonia) Active Problems:   Essential hypertension   Diabetes mellitus type 2, controlled (HCC)   OSA on CPAP   Pneumonia   COPD exacerbation (HCC)   Dyspnea   Hypoxia   Acute respiratory failure with hypoxia (HCC)   Acute on chronic hypercarbic respiratory failure with respiratory acidosis - improved on BiPAP, appreciate pulm consult - Per pulmonology recommendations, please patient on nocturnal BiPAP - Likely acute on chronic diastolic CHF plays a role, start standing by mouth Lasix today  HCAP (healthcare-associated pneumonia) - component of aspiration as well - MRSA negative, discontinue Vancomycin - Discontinue IV antibiotics, narrowed to Levaquin  Mild elevation in cardiac biomarkers - EKG shows a right axis deviation right bundle branch block with nonspecific T-wave changes. - fluid overload on admssion CXR - troponin flat, now c/w ACS but rather demand. Echo with normal EF, grade 1 DD  Acute on chronic CHFpEF - s/p Lasix x 3 on 7/26 - Place on 40 mg of by mouth Lasix  Acute encephalopathy - in the setting of hypercarbia  Essential hypertension - Continue metoprolol and Norvasc. Resumed Lasix  Diabetes mellitus type 2, controlled (HCC) - CBGs controlled.  - Continue long-acting insulin and resistent sliding scale  OSA on CPAP - On BiPAP daily at bedtime as per pulmonology recommendations as above. She will need a repeat sleep study as an outpatient  Multifocal atrial tachycardia - Probably due to healthcare associated pneumonia we'll continue to  monitor on telemetry her heart rate has been ranging 100-106. - Resolved with improvement in her respiratory status  Morbidly obese  History of CVA - With residual left-sided weakness, slurred speech - Currently residing in an SNF   DVT prophylaxis: Lovenox Code Status: Full code Family Communication: No family at bedside Disposition Plan: SNF likely one day  Consultants:   PCCM  Procedures:   2D echo  Antimicrobials:  Vancomycin 7/24 >> 7/26  Aztreonam 7/24 >> 7/28   Clindamycin 7/26 >> 7/28  Levofloxacin 7/28 >>  Subjective: - no chest pain, shortness of breath, no abdominal pain, nausea or vomiting. Feels great  Objective: Vitals:   11/21/15 0400 11/21/15 0419 11/21/15 0451 11/21/15 0806  BP: 132/69     Pulse: 80  80   Resp: (!) 23  13   Temp:    98.4 F (36.9 C)  TempSrc:    Oral  SpO2: 96%  96%   Weight:  113.6 kg (250 lb 7.1 oz)    Height:        Intake/Output Summary (Last 24 hours) at 11/21/15 0957 Last data filed at 11/21/15 0507  Gross per 24 hour  Intake              300 ml  Output                0 ml  Net              300 ml   Filed Weights   11/19/15 0504 11/19/15 1856 11/21/15 0419  Weight: 110.1 kg (242 lb 11.6 oz) 112 kg (246 lb 14.6 oz) 113.6 kg (250 lb 7.1 oz)  Examination: Constitutional: NAD Vitals:   11/21/15 0400 11/21/15 0419 11/21/15 0451 11/21/15 0806  BP: 132/69     Pulse: 80  80   Resp: (!) 23  13   Temp:    98.4 F (36.9 C)  TempSrc:    Oral  SpO2: 96%  96%   Weight:  113.6 kg (250 lb 7.1 oz)    Height:       Eyes: PERRL ENMT: Mucous membranes are moist. Neck: normal, supple, no masses, no thyromegaly Respiratory: no wheezing, no crackles. BiPAP on Cardiovascular: Regular rate and rhythm, no murmurs / rubs / gallops.  Abdomen: no tenderness. Bowel sounds positive.  Neurologic: left sided weakness   Data Reviewed: I have personally reviewed following labs and imaging studies  CBC:  Recent Labs Lab  11/17/15 1828 11/18/15 0513 11/20/15 0310 11/21/15 0341  WBC 7.4 4.8 4.5 4.7  NEUTROABS 5.0 4.1  --   --   HGB 11.7* 11.5* 10.5* 11.2*  HCT 40.0 38.2 35.0* 36.2  MCV 89.5 86.6 86.8 85.6  PLT 220 228 172 160   Basic Metabolic Panel:  Recent Labs Lab 11/17/15 1828 11/18/15 0513 11/20/15 0310 11/21/15 0341  NA 141 141 142 142  K 4.2 4.9 4.6 4.3  CL 101 103 102 102  CO2 32 33* 33* 35*  GLUCOSE 146* 209* 84 115*  BUN 15 15 17 14   CREATININE 0.73 0.52 0.81 0.72  CALCIUM 9.2 8.7* 8.5* 8.6*   GFR: Estimated Creatinine Clearance: 63.5 mL/min (by C-G formula based on SCr of 0.8 mg/dL). Liver Function Tests:  Recent Labs Lab 11/18/15 0513  AST 21  ALT 15  ALKPHOS 33*  BILITOT 1.1  PROT 6.8  ALBUMIN 3.6   No results for input(s): LIPASE, AMYLASE in the last 168 hours. No results for input(s): AMMONIA in the last 168 hours. Coagulation Profile: No results for input(s): INR, PROTIME in the last 168 hours. Cardiac Enzymes:  Recent Labs Lab 11/18/15 0705 11/19/15 1059  TROPONINI 0.03* 0.04*   BNP (last 3 results) No results for input(s): PROBNP in the last 8760 hours. HbA1C: No results for input(s): HGBA1C in the last 72 hours. CBG:  Recent Labs Lab 11/20/15 1649 11/20/15 1905 11/20/15 2326 11/21/15 0344 11/21/15 0710  GLUCAP 166* 183* 175* 124* 112*   Lipid Profile: No results for input(s): CHOL, HDL, LDLCALC, TRIG, CHOLHDL, LDLDIRECT in the last 72 hours. Thyroid Function Tests: No results for input(s): TSH, T4TOTAL, FREET4, T3FREE, THYROIDAB in the last 72 hours. Anemia Panel: No results for input(s): VITAMINB12, FOLATE, FERRITIN, TIBC, IRON, RETICCTPCT in the last 72 hours. Urine analysis: No results found for: COLORURINE, APPEARANCEUR, LABSPEC, PHURINE, GLUCOSEU, HGBUR, BILIRUBINUR, KETONESUR, PROTEINUR, UROBILINOGEN, NITRITE, LEUKOCYTESUR Sepsis Labs: Invalid input(s): PROCALCITONIN, LACTICIDVEN  Recent Results (from the past 240 hour(s))    Culture, blood (routine x 2)     Status: None (Preliminary result)   Collection Time: 11/17/15  6:29 PM  Result Value Ref Range Status   Specimen Description BLOOD RIGHT HAND  Final   Special Requests BOTTLES DRAWN AEROBIC AND ANAEROBIC 5CC  Final   Culture   Final    NO GROWTH 3 DAYS Performed at [facility]    Report Status PENDING  Incomplete  Culture, blood (routine x 2)     Status: None (Preliminary result)   Collection Time: 11/17/15  6:34 PM  Result Value Ref Range Status   Specimen Description BLOOD LEFT HAND  Final   Special Requests BOTTLES DRAWN AEROBIC AND  ANAEROBIC 5CC  Final   Culture   Final    NO GROWTH 3 DAYS Performed at [facility]    Report Status PENDING  Incomplete  MRSA PCR Screening     Status: None   Collection Time: 11/18/15  4:15 PM  Result Value Ref Range Status   MRSA by PCR NEGATIVE NEGATIVE Final    Comment:        The GeneXpert MRSA Assay (FDA approved for NASAL specimens only), is one component of a comprehensive MRSA colonization surveillance program. It is not intended to diagnose MRSA infection nor to guide or monitor treatment for MRSA infections.   Culture, sputum-assessment     Status: None   Collection Time: 11/19/15  4:17 PM  Result Value Ref Range Status   Specimen Description SPUTUM  Final   Special Requests NONE  Final   Sputum evaluation   Final    THIS SPECIMEN IS ACCEPTABLE. RESPIRATORY CULTURE REPORT TO FOLLOW.   Report Status 11/19/2015 FINAL  Final  Culture, respiratory (NON-Expectorated)     Status: None (Preliminary result)   Collection Time: 11/19/15  4:17 PM  Result Value Ref Range Status   Specimen Description SPUTUM  Final   Special Requests NONE  Final   Gram Stain   Final    ABUNDANT WBC PRESENT,BOTH PMN AND MONONUCLEAR ABUNDANT YEAST FEW GRAM VARIABLE ROD RARE GRAM POSITIVE COCCI IN PAIRS FEW SQUAMOUS EPITHELIAL CELLS PRESENT    Culture   Final    CULTURE REINCUBATED FOR BETTER  GROWTH Performed at [facility]    Report Status PENDING  Incomplete  MRSA PCR Screening     Status: None   Collection Time: 11/19/15  6:32 PM  Result Value Ref Range Status   MRSA by PCR NEGATIVE NEGATIVE Final    Comment:        The GeneXpert MRSA Assay (FDA approved for NASAL specimens only), is one component of a comprehensive MRSA colonization surveillance program. It is not intended to diagnose MRSA infection nor to guide or monitor treatment for MRSA infections.       Radiology Studies: Ct Angio Chest Pe W Or Wo Contrast  Result Date: 11/19/2015 CLINICAL DATA:  Chest pain with cough ; shortness of breath EXAM: CT ANGIOGRAPHY CHEST WITH CONTRAST TECHNIQUE: Multidetector CT imaging of the chest was performed using the standard protocol during bolus administration of intravenous contrast. Multiplanar CT image reconstructions and MIPs were obtained to evaluate the vascular anatomy. CONTRAST:  100 mL Isovue 370 nonionic COMPARISON:  Chest radiograph November 17, 2015 FINDINGS: Cardiovascular: There is no demonstrable pulmonary embolus. There is no thoracic aortic aneurysm or dissection. There are scattered foci of calcification in the thoracic aorta. The visualized great vessels appear unremarkable. Heart is enlarged. Pericardium is not appreciably thickened. Mediastinum/Nodes: There is a benign-appearing calcification along the anterior aspect of the right lobe of the thyroid measuring 7 x 5 mm. Thyroid appears inhomogeneous and attenuation, likely due to multinodular goiter. There is no appreciable thoracic adenopathy. Lungs/Pleura: There is a sizable right pleural effusion with a much smaller left pleural effusion. There is interstitial edema lower lobes. There is airspace consolidation in the right lower lobe and medial segment right middle lobe. There is patchy atelectasis in the left upper lobe. There is a focal area of consolidation in the lingula. Upper Abdomen: Visualized  upper abdominal structures are unremarkable. Musculoskeletal: There is degenerative change in the thoracic spine and in each shoulder. There are no blastic or  lytic bone lesions. Review of the MIP images confirms the above findings. IMPRESSION: No demonstrable pulmonary embolus. There is cardiomegaly with bilateral pleural effusions, larger on the right than on the left, we underlying interstitial edema. These findings are felt to represent a degree of congestive heart failure. Areas of airspace consolidation in the right lower lobe, right middle lobe, and lingula, felt to represent superimposed multifocal pneumonia. No adenopathy. Multinodular goiter. Electronically Signed   By: Bretta Bang III M.D.   On: 11/19/2015 11:14  Dg Chest Port 1 View  Result Date: 11/20/2015 CLINICAL DATA:  Respiratory failure. EXAM: PORTABLE CHEST 1 VIEW COMPARISON:  11/19/2015.  CT 11/19/2015 . FINDINGS: Cardiomegaly with bilateral pulmonary alveolar infiltrates and bilateral pleural effusions. Slight worsening from prior exam. Findings consistent congestive heart failure. Bilateral pneumonia cannot be excluded. No pneumothorax. IMPRESSION: Persistent cardiomegaly with bilateral pulmonary infiltrates and pleural effusions. Slight worsening from prior exam. Findings consistent congestive heart failure . Bilateral pneumonia cannot be excluded . Electronically Signed   By: Maisie Fus  Register   On: 11/20/2015 06:56  Dg Chest Port 1 View  Result Date: 11/19/2015 CLINICAL DATA:  Dyspnea EXAM: PORTABLE CHEST 1 VIEW COMPARISON:  CTA chest dated 11/19/2015 FINDINGS: Cardiomegaly with pulmonary vascular congestion and possible mild interstitial edema. Small bilateral pleural effusions, right greater than left. No pneumothorax. IMPRESSION: Cardiomegaly with pulmonary vascular congestion and possible mild interstitial edema. Small bilateral pleural effusions, right greater than left. Electronically Signed   By: Charline Bills M.D.    On: 11/19/2015 18:32  Dg Swallowing Func-speech Pathology  Result Date: 11/20/2015 Objective Swallowing Evaluation: Type of Study: MBS-Modified Barium Swallow Study Patient Details Name: Jorge Lumia MRN: [id number] Date of Birth: Aug 19, 1930 Today's Date: 11/20/2015 Time: SLP Start Time (ACUTE ONLY): 1250-SLP Stop Time (ACUTE ONLY): 1310 SLP Time Calculation (min) (ACUTE ONLY): 20 min Past Medical History: Past Medical History: Diagnosis Date . Depression  . Diabetes (HCC)  . Hemiplegia [facility])  Past Surgical History: No past surgical history on file. HPI: 82 yo female adm to [facility] with tacypnea, cough, SOB x1 week prior to admit.  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  CXR showed airspace consolidation vs asymmetric edema.  Swallow eval ordered.   Subjective: pt awake in bed Assessment / Plan / Recommendation CHL IP CLINICAL IMPRESSIONS 11/20/2015 Therapy Diagnosis Mild oral phase dysphagia Clinical Impression Pt presents with a mild oral dysphagia, likely due to residual weakness from prior CVA with mild left anterior spillage and mild buccal and floor of mouth residuals that spill anteriorly post swallow. Pharyngeal phase WNL. Pt may continue a mechanical soft diet given missing dentition and thin liquids with basic aspiration precautions. No SLP f/u needed given relatively normal swallow. Will sign off.  Impact on safety and function Mild aspiration risk   CHL IP TREATMENT RECOMMENDATION 11/20/2015 Treatment Recommendations No treatment recommended at this time   Prognosis 11/18/2015 Prognosis for Safe Diet Advancement Good Barriers to Reach Goals Cognitive deficits Barriers/Prognosis Comment -- CHL IP DIET RECOMMENDATION 11/20/2015 SLP Diet Recommendations Dysphagia 3 (Mech soft) solids;Thin liquid Liquid Administration via Cup;Straw Medication Administration Whole meds with puree Compensations Slow rate;Small sips/bites;Lingual sweep for clearance of pocketing Postural Changes --   CHL IP  OTHER RECOMMENDATIONS 11/20/2015 Recommended Consults -- Oral Care Recommendations Oral care BID Other Recommendations --   CHL IP FOLLOW UP RECOMMENDATIONS 11/20/2015 Follow up Recommendations None   CHL IP FREQUENCY AND DURATION 11/18/2015 Speech Therapy Frequency (ACUTE ONLY) min  1 x/week Treatment Duration 1 week      CHL IP ORAL PHASE 11/20/2015 Oral Phase Impaired Oral - Pudding Teaspoon -- Oral - Pudding Cup -- Oral - Honey Teaspoon -- Oral - Honey Cup -- Oral - Nectar Teaspoon -- Oral - Nectar Cup -- Oral - Nectar Straw -- Oral - Thin Teaspoon -- Oral - Thin Cup Delayed oral transit;Left anterior bolus loss;Left pocketing in lateral sulci Oral - Thin Straw Delayed oral transit;Left anterior bolus loss;Left pocketing in lateral sulci Oral - Puree WFL Oral - Mech Soft WFL Oral - Regular -- Oral - Multi-Consistency -- Oral - Pill Other (Comment) Oral Phase - Comment --  CHL IP PHARYNGEAL PHASE 11/20/2015 Pharyngeal Phase WFL Pharyngeal- Pudding Teaspoon -- Pharyngeal -- Pharyngeal- Pudding Cup -- Pharyngeal -- Pharyngeal- Honey Teaspoon -- Pharyngeal -- Pharyngeal- Honey Cup -- Pharyngeal -- Pharyngeal- Nectar Teaspoon -- Pharyngeal -- Pharyngeal- Nectar Cup -- Pharyngeal -- Pharyngeal- Nectar Straw -- Pharyngeal -- Pharyngeal- Thin Teaspoon -- Pharyngeal -- Pharyngeal- Thin Cup -- Pharyngeal -- Pharyngeal- Thin Straw -- Pharyngeal -- Pharyngeal- Puree -- Pharyngeal -- Pharyngeal- Mechanical Soft -- Pharyngeal -- Pharyngeal- Regular -- Pharyngeal -- Pharyngeal- Multi-consistency -- Pharyngeal -- Pharyngeal- Pill -- Pharyngeal -- Pharyngeal Comment --  CHL IP CERVICAL ESOPHAGEAL PHASE 11/20/2015 Cervical Esophageal Phase WFL Pudding Teaspoon -- Pudding Cup -- Honey Teaspoon -- Honey Cup -- Nectar Teaspoon -- Nectar Cup -- Nectar Straw -- Thin Teaspoon -- Thin Cup -- Thin Straw -- Puree -- Mechanical Soft -- Regular -- Multi-consistency -- Pill -- Cervical Esophageal Comment -- No flowsheet data found. Harlon Ditty,  [place] CCC-SLP [phone] [place], Riley Nearing 11/20/2015, 2:20 PM                Scheduled Meds: . antiseptic oral rinse  7 mL Mouth Rinse q12n4p  . aztreonam  1 g Intravenous Q8H  . chlorhexidine  15 mL Mouth Rinse BID  . clindamycin (CLEOCIN) IV  600 mg Intravenous Q8H  . enoxaparin (LOVENOX) injection  40 mg Subcutaneous QHS  . famotidine (PEPCID) IV  20 mg Intravenous Q12H  . insulin aspart  0-20 Units Subcutaneous Q4H  . polyvinyl alcohol  1 drop Both Eyes BID  . senna  2 tablet Oral QHS   Continuous Infusions:    Pamella Pert, MD, PhD [facility] Pager [phone]  If 7PM-7AM, please contact night-coverage [email or web address] Password [facility] 11/21/2015, 9:57 AM

## 2015-11-22 LAB — CULTURE, BLOOD (ROUTINE X 2)
CULTURE: NO GROWTH
Culture: NO GROWTH

## 2015-11-22 LAB — GLUCOSE, CAPILLARY
GLUCOSE-CAPILLARY: 123 mg/dL — AB (ref 65–99)
GLUCOSE-CAPILLARY: 165 mg/dL — AB (ref 65–99)
GLUCOSE-CAPILLARY: 174 mg/dL — AB (ref 65–99)
Glucose-Capillary: 123 mg/dL — ABNORMAL HIGH (ref 65–99)

## 2015-11-22 LAB — PROCALCITONIN

## 2015-11-22 MED ORDER — INSULIN GLARGINE 100 UNIT/ML ~~LOC~~ SOLN: 10.0000 [IU] | Freq: Two times a day (BID) | SUBCUTANEOUS | 11 refills | Status: DC

## 2015-11-22 MED ORDER — LISINOPRIL 10 MG PO TABS: 20.0000 mg | ORAL_TABLET | Freq: Every day | ORAL | Status: DC

## 2015-11-22 MED ORDER — METOPROLOL TARTRATE 50 MG PO TABS
50.0000 mg | ORAL_TABLET | Freq: Two times a day (BID) | ORAL | Status: DC
  Administered 2015-11-22: 50 mg via ORAL
  Filled 2015-11-22: qty 1

## 2015-11-22 MED ORDER — AMLODIPINE BESYLATE 10 MG PO TABS: 5.0000 mg | ORAL_TABLET | Freq: Every day | ORAL | Status: DC

## 2015-11-22 MED ORDER — LEVOFLOXACIN 500 MG PO TABS
500.0000 mg | ORAL_TABLET | Freq: Every day | ORAL | 0 refills | Status: DC
Start: 2015-11-22 — End: 2018-06-13

## 2015-11-22 NOTE — Progress Notes (Signed)
Patient is set to discharge to [facility] SNF today. Patient & friend, Leo Grosser, aware. Discharge packet given to RN, Tess. [facility] called for transport.   Stacy Gardner, LCSWA Clinical Social Worker [phone]

## 2015-11-22 NOTE — Discharge Summary (Signed)
Physician Discharge Summary  Canary Fister RUE:[id number] DOB: [birth year] DOA: 11/17/2015  PCP: Katy Apo, MD  Admit date: 11/17/2015 Discharge date: 11/22/2015  Admitted From: SNF Disposition:  SNF  Recommendations for Outpatient Follow-up:  1. Follow up with PCP in 1-2 weeks 2. Please obtain BMP/CBC in one week 3. BP and DM regimen changed as below. Please continue to monitor and uptitrate as indicated 4. Continue Levaquin for 3 additional days   Home Health: none Equipment/Devices: none  Discharge Condition: stable CODE STATUS: Full Diet recommendation: dysphagia 3  HPI: Kimesha Claxton an 82 y.o.femalepast medical history stroke with left-sided hemiparesis, essential hypertension, diabetes mellitus hyperlipidemia thatcomes in with productive cough and shortness of breath.  Hospital Course: Discharge Diagnoses:  Principal Problem:   HCAP (healthcare-associated pneumonia) Active Problems:   Essential hypertension   Diabetes mellitus type 2, controlled (HCC)   OSA on CPAP   Pneumonia   COPD exacerbation (HCC)   Dyspnea   Hypoxia   Acute respiratory failure with hypoxia (HCC)  Acute on chronic hypercarbic respiratory failure with respiratory acidosis - patient with respiratory decompensation in the setting of HCAP, underlying obesity hypoventilation and OSA, requiring stepdown transfer and BiPAP placement on 7/26. Critical care was consulted and have followed. Patient improved on BiPAP without need for intubation and remained stable since. Continue nightly CPAP. Recommend 100% compliance otherwise she can decompensate in am. Will discontinue medications that have potential to make patient drowsy, as below. HCAP (healthcare-associated pneumonia) - component of aspiration as well, received broad spectrum antibiotics for 5 days while hospitalized, narrow to Levaquin for 3 additional days.  Mild elevation in cardiac biomarkers - EKG shows a right axis deviation right  bundle branch block with nonspecific T-wave changes. Fluid overload on admssion CXR, troponin flat, not consistent with ACS but rather demand. Echo with normal EF, grade 1 DD as below. No chest pain.  Acute on chronic CHFpEF - s/p Lasix x 3 on 7/26, resume Lasix.  Acute encephalopathy - in the setting of hypercarbia, resolved Essential hypertension - Continue metoprolol and Norvasc. Resumed Lasix. She did well off clonidine, discontinue. Diabetes mellitus type 2, controlled (HCC) - CBGs controlled, Continue long-acting insulin and resistent sliding scale. Changes as below.  OSA on CPAP - continue Multifocal atrial tachycardia - Probably due to healthcare associated pneumonia, improve with resumption of Metoprolol. Heart rate in the 70s on telemetry on discharge Morbidly obese - please work hard on weight loss. This will improve HTN, OSA, CHF, DM, chronic pain and quality of life.  History of CVA - With residual left-sided weakness, slurred speech, Currently residing in an SNF. Dysphagia 3 diet per SLP.   Discharge Instructions     Medication List    STOP taking these medications   cloNIDine 0.1 MG tablet Commonly known as:  CATAPRES   gabapentin 300 MG capsule Commonly known as:  NEURONTIN   pregabalin 50 MG capsule Commonly known as:  LYRICA     TAKE these medications   acetaminophen 650 MG CR tablet Commonly known as:  TYLENOL Take 650 mg by mouth every 12 (twelve) hours.   amLODipine 10 MG tablet Commonly known as:  NORVASC Take 0.5 tablets (5 mg total) by mouth daily. What changed:  how much to take   aspirin EC 81 MG tablet Take 81 mg by mouth daily.   atorvastatin 10 MG tablet Commonly known as:  LIPITOR Take 10 mg by mouth at bedtime.   cholecalciferol 1000 units tablet Commonly known as:  VITAMIN D Take 2,000 Units by mouth daily.   docusate sodium 100 MG capsule Commonly known as:  COLACE Take 100 mg by mouth 2 (two) times daily.   DULoxetine 60 MG  capsule Commonly known as:  CYMBALTA Take 60 mg by mouth daily.   ferrous sulfate 325 (65 FE) MG tablet Take 325 mg by mouth daily with breakfast.   furosemide 20 MG tablet Commonly known as:  LASIX Take 20 mg by mouth daily.   insulin glargine 100 UNIT/ML injection Commonly known as:  LANTUS Inject 0.1 mLs (10 Units total) into the skin every 12 (twelve) hours. What changed:  how much to take   levofloxacin 500 MG tablet Commonly known as:  LEVAQUIN Take 1 tablet (500 mg total) by mouth daily. For 3 more days   lisinopril 10 MG tablet Commonly known as:  PRINIVIL,ZESTRIL Take 2 tablets (20 mg total) by mouth daily. What changed:  medication strength  how much to take   magnesium oxide 400 (241.3 Mg) MG tablet Commonly known as:  MAG-OX Take 400 mg by mouth daily.   metFORMIN 1000 MG tablet Commonly known as:  GLUCOPHAGE Take 1,000 mg by mouth 2 (two) times daily with a meal.   metoprolol succinate 50 MG 24 hr tablet Commonly known as:  TOPROL-XL Take 50-100 mg by mouth 2 (two) times daily. Pt takes two tablets in the morning and one tablet at bedtime.   omeprazole 20 MG capsule Commonly known as:  PRILOSEC Take 20 mg by mouth daily before breakfast.   polyethylene glycol packet Commonly known as:  MIRALAX / GLYCOLAX Take 17 g by mouth at bedtime.   REFRESH OPTIVE 1-0.9 % Gel Generic drug:  Carboxymethylcellul-Glycerin Place 1 drop into both eyes 2 (two) times daily.   senna 8.6 MG tablet Commonly known as:  SENOKOT Take 2 tablets by mouth at bedtime.       Allergies  Allergen Reactions  . Ampicillin Shortness Of Breath    Reaction:  Unknown  Has patient had a PCN reaction causing immediate rash, facial/tongue/throat swelling, SOB or lightheadedness with hypotension:  Unsure Has patient had a PCN reaction causing severe rash involving mucus membranes or skin necrosis: Unsure Has patient had a PCN reaction that required hospitalization Unsure Has  patient had a PCN reaction occurring within the last 10 years: Unsure If all of the above answers are "NO", then may proceed with Cephalosporin use.    Consultations:  PCCM  Procedures/Studies:  2D echo Study Conclusions - Left ventricle: The cavity size was normal. There was mild concentric hypertrophy. Systolic function was normal. The estimated ejection fraction was in the range of 55% to 60%. Doppler parameters are consistent with abnormal left ventricular relaxation (grade 1 diastolic dysfunction). - Ventricular septum: The contour showed diastolic flattening. - Aortic valve: Mildly thickened, mildly calcified leaflets. Cusp separation was reduced. - Mitral valve: Calcified annulus. - Left atrium: The atrium was mildly dilated. Volume/bsa, ES (1-plane Simpson&'s, A4C): 38.2 ml/m^2. - Right ventricle: The cavity size was moderately dilated. Wall thickness was normal. Systolic function was moderately reduced. - Right atrium: The atrium was severely dilated. Central venous pressure (est): 15 mm Hg. - Inferior vena cava: The vessel was dilated. The respirophasic diameter changes were blunted (< 50%), consistent with elevated central venous pressure.  Dg Chest 2 View  Result Date: 11/17/2015 CLINICAL DATA:  Clinical suspicion for pneumonia. EXAM: CHEST  2 VIEW COMPARISON:  None. FINDINGS: The cardiac silhouette is enlarged. Mediastinal contours appear  intact. Atherosclerotic calcifications of the aortic arch are noted. There is no evidence of pneumothorax. There is mixed alveolar and interstitial pattern pulmonary edema. More focal areas of airspace consolidation are seen in bilateral lower lobes. There are probably bilateral small to moderate pleural effusions. Osseous structures are without acute abnormality. Soft tissues are grossly normal. IMPRESSION: Enlarged cardiac silhouette. Mixed interstitial and alveolar pulmonary edema. More focal areas of airspace disease in bilateral lower lobes  may represent focal airspace consolidation versus areas of asymmetric alveolar edema. Probably bilateral pleural effusions. Electronically Signed   By: Ted Mcalpine M.D.   On: 11/17/2015 17:39  Ct Angio Chest Pe W Or Wo Contrast  Result Date: 11/19/2015 CLINICAL DATA:  Chest pain with cough ; shortness of breath EXAM: CT ANGIOGRAPHY CHEST WITH CONTRAST TECHNIQUE: Multidetector CT imaging of the chest was performed using the standard protocol during bolus administration of intravenous contrast. Multiplanar CT image reconstructions and MIPs were obtained to evaluate the vascular anatomy. CONTRAST:  100 mL Isovue 370 nonionic COMPARISON:  Chest radiograph November 17, 2015 FINDINGS: Cardiovascular: There is no demonstrable pulmonary embolus. There is no thoracic aortic aneurysm or dissection. There are scattered foci of calcification in the thoracic aorta. The visualized great vessels appear unremarkable. Heart is enlarged. Pericardium is not appreciably thickened. Mediastinum/Nodes: There is a benign-appearing calcification along the anterior aspect of the right lobe of the thyroid measuring 7 x 5 mm. Thyroid appears inhomogeneous and attenuation, likely due to multinodular goiter. There is no appreciable thoracic adenopathy. Lungs/Pleura: There is a sizable right pleural effusion with a much smaller left pleural effusion. There is interstitial edema lower lobes. There is airspace consolidation in the right lower lobe and medial segment right middle lobe. There is patchy atelectasis in the left upper lobe. There is a focal area of consolidation in the lingula. Upper Abdomen: Visualized upper abdominal structures are unremarkable. Musculoskeletal: There is degenerative change in the thoracic spine and in each shoulder. There are no blastic or lytic bone lesions. Review of the MIP images confirms the above findings. IMPRESSION: No demonstrable pulmonary embolus. There is cardiomegaly with bilateral pleural  effusions, larger on the right than on the left, we underlying interstitial edema. These findings are felt to represent a degree of congestive heart failure. Areas of airspace consolidation in the right lower lobe, right middle lobe, and lingula, felt to represent superimposed multifocal pneumonia. No adenopathy. Multinodular goiter. Electronically Signed   By: Bretta Bang III M.D.   On: 11/19/2015 11:14  Dg Chest Port 1 View  Result Date: 11/20/2015 CLINICAL DATA:  Respiratory failure. EXAM: PORTABLE CHEST 1 VIEW COMPARISON:  11/19/2015.  CT 11/19/2015 . FINDINGS: Cardiomegaly with bilateral pulmonary alveolar infiltrates and bilateral pleural effusions. Slight worsening from prior exam. Findings consistent congestive heart failure. Bilateral pneumonia cannot be excluded. No pneumothorax. IMPRESSION: Persistent cardiomegaly with bilateral pulmonary infiltrates and pleural effusions. Slight worsening from prior exam. Findings consistent congestive heart failure . Bilateral pneumonia cannot be excluded . Electronically Signed   By: Maisie Fus  Register   On: 11/20/2015 06:56  Dg Chest Port 1 View  Result Date: 11/19/2015 CLINICAL DATA:  Dyspnea EXAM: PORTABLE CHEST 1 VIEW COMPARISON:  CTA chest dated 11/19/2015 FINDINGS: Cardiomegaly with pulmonary vascular congestion and possible mild interstitial edema. Small bilateral pleural effusions, right greater than left. No pneumothorax. IMPRESSION: Cardiomegaly with pulmonary vascular congestion and possible mild interstitial edema. Small bilateral pleural effusions, right greater than left. Electronically Signed   By: Roselie Awkward.D.  On: 11/19/2015 18:32  Dg Swallowing Func-speech Pathology  Result Date: 11/20/2015 Objective Swallowing Evaluation: Type of Study: MBS-Modified Barium Swallow Study Patient Details Name: Dacey Milberger MRN: [id number] Date of Birth: [birth year] Today's Date: 11/20/2015 Time: SLP Start Time (ACUTE ONLY): 1250-SLP Stop Time  (ACUTE ONLY): 1310 SLP Time Calculation (min) (ACUTE ONLY): 20 min Past Medical History: Past Medical History: Diagnosis Date . Depression  . Diabetes (HCC)  . Hemiplegia [facility])  Past Surgical History: No past surgical history on file. HPI: 82 yo female adm to [facility] with tacypnea, cough, SOB x1 week prior to admit.  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  CXR showed airspace consolidation vs asymmetric edema.  Swallow eval ordered.   Subjective: pt awake in bed Assessment / Plan / Recommendation CHL IP CLINICAL IMPRESSIONS 11/20/2015 Therapy Diagnosis Mild oral phase dysphagia Clinical Impression Pt presents with a mild oral dysphagia, likely due to residual weakness from prior CVA with mild left anterior spillage and mild buccal and floor of mouth residuals that spill anteriorly post swallow. Pharyngeal phase WNL. Pt may continue a mechanical soft diet given missing dentition and thin liquids with basic aspiration precautions. No SLP f/u needed given relatively normal swallow. Will sign off.  Impact on safety and function Mild aspiration risk   CHL IP TREATMENT RECOMMENDATION 11/20/2015 Treatment Recommendations No treatment recommended at this time   Prognosis 11/18/2015 Prognosis for Safe Diet Advancement Good Barriers to Reach Goals Cognitive deficits Barriers/Prognosis Comment -- CHL IP DIET RECOMMENDATION 11/20/2015 SLP Diet Recommendations Dysphagia 3 (Mech soft) solids;Thin liquid Liquid Administration via Cup;Straw Medication Administration Whole meds with puree Compensations Slow rate;Small sips/bites;Lingual sweep for clearance of pocketing Postural Changes --   CHL IP OTHER RECOMMENDATIONS 11/20/2015 Recommended Consults -- Oral Care Recommendations Oral care BID Other Recommendations --   CHL IP FOLLOW UP RECOMMENDATIONS 11/20/2015 Follow up Recommendations None   CHL IP FREQUENCY AND DURATION 11/18/2015 Speech Therapy Frequency (ACUTE ONLY) min 1 x/week Treatment Duration 1 week      CHL  IP ORAL PHASE 11/20/2015 Oral Phase Impaired Oral - Pudding Teaspoon -- Oral - Pudding Cup -- Oral - Honey Teaspoon -- Oral - Honey Cup -- Oral - Nectar Teaspoon -- Oral - Nectar Cup -- Oral - Nectar Straw -- Oral - Thin Teaspoon -- Oral - Thin Cup Delayed oral transit;Left anterior bolus loss;Left pocketing in lateral sulci Oral - Thin Straw Delayed oral transit;Left anterior bolus loss;Left pocketing in lateral sulci Oral - Puree WFL Oral - Mech Soft WFL Oral - Regular -- Oral - Multi-Consistency -- Oral - Pill Other (Comment) Oral Phase - Comment --  CHL IP PHARYNGEAL PHASE 11/20/2015 Pharyngeal Phase WFL Pharyngeal- Pudding Teaspoon -- Pharyngeal -- Pharyngeal- Pudding Cup -- Pharyngeal -- Pharyngeal- Honey Teaspoon -- Pharyngeal -- Pharyngeal- Honey Cup -- Pharyngeal -- Pharyngeal- Nectar Teaspoon -- Pharyngeal -- Pharyngeal- Nectar Cup -- Pharyngeal -- Pharyngeal- Nectar Straw -- Pharyngeal -- Pharyngeal- Thin Teaspoon -- Pharyngeal -- Pharyngeal- Thin Cup -- Pharyngeal -- Pharyngeal- Thin Straw -- Pharyngeal -- Pharyngeal- Puree -- Pharyngeal -- Pharyngeal- Mechanical Soft -- Pharyngeal -- Pharyngeal- Regular -- Pharyngeal -- Pharyngeal- Multi-consistency -- Pharyngeal -- Pharyngeal- Pill -- Pharyngeal -- Pharyngeal Comment --  CHL IP CERVICAL ESOPHAGEAL PHASE 11/20/2015 Cervical Esophageal Phase WFL Pudding Teaspoon -- Pudding Cup -- Honey Teaspoon -- Honey Cup -- Nectar Teaspoon -- Nectar Cup -- Nectar Straw -- Thin Teaspoon -- Thin Cup -- Thin Straw -- Puree -- Mechanical Soft --  Regular -- Multi-consistency -- Pill -- Cervical Esophageal Comment -- No flowsheet data found. Harlon Ditty, MA CCC-SLP [phone] [place], Riley Nearing 11/20/2015, 2:20 PM                Subjective: - no chest pain, shortness of breath, no abdominal pain, nausea or vomiting. Excited to go back. Tolerating breakfast  Discharge Exam: Vitals:   11/21/15 2200 11/22/15 0406  BP:  (!) 153/92  Pulse: 94 98  Resp: 18 (!) 24    Temp:  98.7 F (37.1 C)   Vitals:   11/21/15 2015 11/21/15 2200 11/22/15 0406 11/22/15 0453  BP: (!) 147/70  (!) 153/92   Pulse: 95 94 98   Resp: 20 18 (!) 24   Temp: 98.6 F (37 C)  98.7 F (37.1 C)   TempSrc: Oral  Oral   SpO2: 99% 99% 99%   Weight:    110.2 kg (242 lb 15.2 oz)  Height:        General: Pt is alert, awake, not in acute distress Cardiovascular: RRR, S1/S2 +, no rubs, no gallops Respiratory: CTA bilaterally, no wheezing, no rhonchi Abdominal: Soft, NT, ND, bowel sounds + Extremities: no edema, no cyanosis    The results of significant diagnostics from this hospitalization (including imaging, microbiology, ancillary and laboratory) are listed below for reference.     Microbiology: Recent Results (from the past 240 hour(s))  Culture, blood (routine x 2)     Status: None (Preliminary result)   Collection Time: 11/17/15  6:29 PM  Result Value Ref Range Status   Specimen Description BLOOD RIGHT HAND  Final   Special Requests BOTTLES DRAWN AEROBIC AND ANAEROBIC 5CC  Final   Culture   Final    NO GROWTH 4 DAYS Performed at [facility]    Report Status PENDING  Incomplete  Culture, blood (routine x 2)     Status: None (Preliminary result)   Collection Time: 11/17/15  6:34 PM  Result Value Ref Range Status   Specimen Description BLOOD LEFT HAND  Final   Special Requests BOTTLES DRAWN AEROBIC AND ANAEROBIC 5CC  Final   Culture   Final    NO GROWTH 4 DAYS Performed at [facility]    Report Status PENDING  Incomplete  MRSA PCR Screening     Status: None   Collection Time: 11/18/15  4:15 PM  Result Value Ref Range Status   MRSA by PCR NEGATIVE NEGATIVE Final    Comment:        The GeneXpert MRSA Assay (FDA approved for NASAL specimens only), is one component of a comprehensive MRSA colonization surveillance program. It is not intended to diagnose MRSA infection nor to guide or monitor treatment for MRSA infections.   Culture,  sputum-assessment     Status: None   Collection Time: 11/19/15  4:17 PM  Result Value Ref Range Status   Specimen Description SPUTUM  Final   Special Requests NONE  Final   Sputum evaluation   Final    THIS SPECIMEN IS ACCEPTABLE. RESPIRATORY CULTURE REPORT TO FOLLOW.   Report Status 11/19/2015 FINAL  Final  Culture, respiratory (NON-Expectorated)     Status: None (Preliminary result)   Collection Time: 11/19/15  4:17 PM  Result Value Ref Range Status   Specimen Description SPUTUM  Final   Special Requests NONE  Final   Gram Stain   Final    ABUNDANT WBC PRESENT,BOTH PMN AND MONONUCLEAR ABUNDANT YEAST FEW GRAM VARIABLE ROD RARE  GRAM POSITIVE COCCI IN PAIRS FEW SQUAMOUS EPITHELIAL CELLS PRESENT    Culture   Final    CULTURE REINCUBATED FOR BETTER GROWTH Performed at [facility]    Report Status PENDING  Incomplete  MRSA PCR Screening     Status: None   Collection Time: 11/19/15  6:32 PM  Result Value Ref Range Status   MRSA by PCR NEGATIVE NEGATIVE Final    Comment:        The GeneXpert MRSA Assay (FDA approved for NASAL specimens only), is one component of a comprehensive MRSA colonization surveillance program. It is not intended to diagnose MRSA infection nor to guide or monitor treatment for MRSA infections.      Labs: BNP (last 3 results)  Recent Labs  11/17/15 1828  BNP 130.0*   Basic Metabolic Panel:  Recent Labs Lab 11/17/15 1828 11/18/15 0513 11/20/15 0310 11/21/15 0341  NA 141 141 142 142  K 4.2 4.9 4.6 4.3  CL 101 103 102 102  CO2 32 33* 33* 35*  GLUCOSE 146* 209* 84 115*  BUN 15 15 17 14   CREATININE 0.73 0.52 0.81 0.72  CALCIUM 9.2 8.7* 8.5* 8.6*   Liver Function Tests:  Recent Labs Lab 11/18/15 0513  AST 21  ALT 15  ALKPHOS 33*  BILITOT 1.1  PROT 6.8  ALBUMIN 3.6   No results for input(s): LIPASE, AMYLASE in the last 168 hours. No results for input(s): AMMONIA in the last 168 hours. CBC:  Recent Labs Lab  11/17/15 1828 11/18/15 0513 11/20/15 0310 11/21/15 0341  WBC 7.4 4.8 4.5 4.7  NEUTROABS 5.0 4.1  --   --   HGB 11.7* 11.5* 10.5* 11.2*  HCT 40.0 38.2 35.0* 36.2  MCV 89.5 86.6 86.8 85.6  PLT 220 228 172 160   Cardiac Enzymes:  Recent Labs Lab 11/18/15 0705 11/19/15 1059  TROPONINI 0.03* 0.04*   BNP: Invalid input(s): POCBNP CBG:  Recent Labs Lab 11/21/15 1558 11/21/15 2012 11/22/15 0029 11/22/15 0403 11/22/15 0758  GLUCAP 167* 145* 123* 123* 165*   D-Dimer No results for input(s): DDIMER in the last 72 hours. Hgb A1c No results for input(s): HGBA1C in the last 72 hours. Lipid Profile No results for input(s): CHOL, HDL, LDLCALC, TRIG, CHOLHDL, LDLDIRECT in the last 72 hours. Thyroid function studies No results for input(s): TSH, T4TOTAL, T3FREE, THYROIDAB in the last 72 hours.  Invalid input(s): FREET3 Anemia work up No results for input(s): VITAMINB12, FOLATE, FERRITIN, TIBC, IRON, RETICCTPCT in the last 72 hours. Urinalysis No results found for: COLORURINE, APPEARANCEUR, LABSPEC, PHURINE, GLUCOSEU, HGBUR, BILIRUBINUR, KETONESUR, PROTEINUR, UROBILINOGEN, NITRITE, LEUKOCYTESUR Sepsis Labs Invalid input(s): PROCALCITONIN,  WBC,  LACTICIDVEN Microbiology Recent Results (from the past 240 hour(s))  Culture, blood (routine x 2)     Status: None (Preliminary result)   Collection Time: 11/17/15  6:29 PM  Result Value Ref Range Status   Specimen Description BLOOD RIGHT HAND  Final   Special Requests BOTTLES DRAWN AEROBIC AND ANAEROBIC 5CC  Final   Culture   Final    NO GROWTH 4 DAYS Performed at [facility]    Report Status PENDING  Incomplete  Culture, blood (routine x 2)     Status: None (Preliminary result)   Collection Time: 11/17/15  6:34 PM  Result Value Ref Range Status   Specimen Description BLOOD LEFT HAND  Final   Special Requests BOTTLES DRAWN AEROBIC AND ANAEROBIC 5CC  Final   Culture   Final    NO  GROWTH 4 DAYS Performed at [facility]    Report Status PENDING  Incomplete  MRSA PCR Screening     Status: None   Collection Time: 11/18/15  4:15 PM  Result Value Ref Range Status   MRSA by PCR NEGATIVE NEGATIVE Final    Comment:        The GeneXpert MRSA Assay (FDA approved for NASAL specimens only), is one component of a comprehensive MRSA colonization surveillance program. It is not intended to diagnose MRSA infection nor to guide or monitor treatment for MRSA infections.   Culture, sputum-assessment     Status: None   Collection Time: 11/19/15  4:17 PM  Result Value Ref Range Status   Specimen Description SPUTUM  Final   Special Requests NONE  Final   Sputum evaluation   Final    THIS SPECIMEN IS ACCEPTABLE. RESPIRATORY CULTURE REPORT TO FOLLOW.   Report Status 11/19/2015 FINAL  Final  Culture, respiratory (NON-Expectorated)     Status: None (Preliminary result)   Collection Time: 11/19/15  4:17 PM  Result Value Ref Range Status   Specimen Description SPUTUM  Final   Special Requests NONE  Final   Gram Stain   Final    ABUNDANT WBC PRESENT,BOTH PMN AND MONONUCLEAR ABUNDANT YEAST FEW GRAM VARIABLE ROD RARE GRAM POSITIVE COCCI IN PAIRS FEW SQUAMOUS EPITHELIAL CELLS PRESENT    Culture   Final    CULTURE REINCUBATED FOR BETTER GROWTH Performed at [facility]    Report Status PENDING  Incomplete  MRSA PCR Screening     Status: None   Collection Time: 11/19/15  6:32 PM  Result Value Ref Range Status   MRSA by PCR NEGATIVE NEGATIVE Final    Comment:        The GeneXpert MRSA Assay (FDA approved for NASAL specimens only), is one component of a comprehensive MRSA colonization surveillance program. It is not intended to diagnose MRSA infection nor to guide or monitor treatment for MRSA infections.      Time coordinating discharge: Over 30 minutes  SIGNED:  Pamella Pert, MD  [facility] 11/22/2015, 9:29 AM Pager [phone]  If 7PM-7AM, please contact  night-coverage [email or web address] Password [email or web address]

## 2015-11-23 LAB — CULTURE, RESPIRATORY W GRAM STAIN

## 2016-02-17 ENCOUNTER — Encounter: Payer: Self-pay | Admitting: Sports Medicine

## 2016-02-17 ENCOUNTER — Ambulatory Visit (INDEPENDENT_AMBULATORY_CARE_PROVIDER_SITE_OTHER): Payer: Medicare Other | Admitting: Sports Medicine

## 2016-02-17 DIAGNOSIS — B351 Tinea unguium: Secondary | ICD-10-CM | POA: Diagnosis not present

## 2016-02-17 DIAGNOSIS — M79676 Pain in unspecified toe(s): Secondary | ICD-10-CM

## 2016-02-17 DIAGNOSIS — R531 Weakness: Secondary | ICD-10-CM

## 2016-02-17 DIAGNOSIS — E1142 Type 2 diabetes mellitus with diabetic polyneuropathy: Secondary | ICD-10-CM | POA: Diagnosis not present

## 2016-02-17 NOTE — Progress Notes (Signed)
Patient ID: Malva LimesJoyce Turri, female   DOB: 12/23/1930, 82 y.o.   MRN: [id number]  Subjective: Malva LimesJoyce Shoe is a 82 y.o. Diabetic female patient seen today in office with complaint of painful thickened and elongated toenails; unable to trim. Patient denies any changes since last visit is assisted by living facility care giver. Patient has no other pedal complaints at this time.   FBS not recorded   Patient Active Problem List   Diagnosis Date Noted  . COPD exacerbation (HCC)   . Dyspnea   . Hypoxia   . Acute respiratory failure with hypoxia (HCC)   . HCAP (healthcare-associated pneumonia) 11/17/2015  . Essential hypertension 11/17/2015  . Diabetes mellitus type 2, controlled (HCC) 11/17/2015  . OSA on CPAP 11/17/2015  . Pneumonia 11/17/2015    Current Outpatient Prescriptions on File Prior to Visit  Medication Sig Dispense Refill  . acetaminophen (TYLENOL) 650 MG CR tablet Take 650 mg by mouth every 12 (twelve) hours.     Marland Kitchen. amLODipine (NORVASC) 10 MG tablet Take 0.5 tablets (5 mg total) by mouth daily.    Marland Kitchen. aspirin EC 81 MG tablet Take 81 mg by mouth daily.    Marland Kitchen. atorvastatin (LIPITOR) 10 MG tablet Take 10 mg by mouth at bedtime.     . Carboxymethylcellul-Glycerin (REFRESH OPTIVE) 1-0.9 % GEL Place 1 drop into both eyes 2 (two) times daily.    . cholecalciferol (VITAMIN D) 1000 units tablet Take 2,000 Units by mouth daily.    Marland Kitchen. docusate sodium (COLACE) 100 MG capsule Take 100 mg by mouth 2 (two) times daily.    . DULoxetine (CYMBALTA) 60 MG capsule Take 60 mg by mouth daily.    . ferrous sulfate 325 (65 FE) MG tablet Take 325 mg by mouth daily with breakfast.    . furosemide (LASIX) 20 MG tablet Take 20 mg by mouth daily.     . insulin glargine (LANTUS) 100 UNIT/ML injection Inject 0.1 mLs (10 Units total) into the skin every 12 (twelve) hours. 10 mL 11  . levofloxacin (LEVAQUIN) 500 MG tablet Take 1 tablet (500 mg total) by mouth daily. For 3 more days 3 tablet 0  . lisinopril  (PRINIVIL,ZESTRIL) 10 MG tablet Take 2 tablets (20 mg total) by mouth daily.    . magnesium oxide (MAG-OX) 400 (241.3 Mg) MG tablet Take 400 mg by mouth daily.    . metFORMIN (GLUCOPHAGE) 1000 MG tablet Take 1,000 mg by mouth 2 (two) times daily with a meal.    . metoprolol succinate (TOPROL-XL) 50 MG 24 hr tablet Take 50-100 mg by mouth 2 (two) times daily. Pt takes two tablets in the morning and one tablet at bedtime.    Marland Kitchen. omeprazole (PRILOSEC) 20 MG capsule Take 20 mg by mouth daily before breakfast.     . polyethylene glycol (MIRALAX / GLYCOLAX) packet Take 17 g by mouth at bedtime.    . senna (SENOKOT) 8.6 MG tablet Take 2 tablets by mouth at bedtime.      No current facility-administered medications on file prior to visit.     Allergies  Allergen Reactions  . Ampicillin Shortness Of Breath    Reaction:  Unknown  Has patient had a PCN reaction causing immediate rash, facial/tongue/throat swelling, SOB or lightheadedness with hypotension:  Unsure Has patient had a PCN reaction causing severe rash involving mucus membranes or skin necrosis: Unsure Has patient had a PCN reaction that required hospitalization Unsure Has patient had a PCN reaction occurring within the last  10 years: Unsure If all of the above answers are "NO", then may proceed with Cephalosporin use.    Objective: Physical Exam  General: Well developed, nourished, no acute distress, awake, alert and oriented x 3 in wheelchair  Vascular: Dorsalis pedis artery 1/4 bilateral, Posterior tibial artery 0/4 bilateral, skin temperature warm to warm proximal to distal bilateral lower extremities, no varicosities, decreased pedal hair present bilateral.  Neurological: Gross sensation present via light touch bilateral.   Dermatological: Skin is warm, dry, and supple bilateral, Nails 1-10 are tender, long, thick, and discolored with mild subungal debris with spicules at hallux nail, no webspace macerations present bilateral, no  open lesions present bilateral, no callus/corns/hyperkeratotic tissue present bilateral. No signs of infection bilateral.  Musculoskeletal: Hammertoe deformities noted bilateral. Muscular strength decreased with left sided weakness without pain on range of motion. No pain with calf compression bilateral.  Assessment and Plan:  Problem List Items Addressed This Visit    None    Visit Diagnoses    Pain due to onychomycosis of toenail    -  Primary   Left-sided weakness       Diabetic peripheral neuropathy associated with type 2 diabetes mellitus (HCC)         -Examined patient.  -Discussed treatment options for painful mycotic nails. -Mechanically debrided and reduced mycotic nails with sterile nail nipper and dremel nail file without incident. -Advised daily skin emollients for dry skin -Encouraged daily foot inspection in the setting of diabetes  -Patient to return in 3 months for follow up evaluation or sooner if symptoms worsen.  Asencion Islam, DPM

## 2016-05-18 ENCOUNTER — Ambulatory Visit (INDEPENDENT_AMBULATORY_CARE_PROVIDER_SITE_OTHER): Payer: Medicare Other | Admitting: Sports Medicine

## 2016-05-18 ENCOUNTER — Encounter: Payer: Self-pay | Admitting: Sports Medicine

## 2016-05-18 DIAGNOSIS — B351 Tinea unguium: Secondary | ICD-10-CM

## 2016-05-18 DIAGNOSIS — M79676 Pain in unspecified toe(s): Secondary | ICD-10-CM

## 2016-05-18 DIAGNOSIS — R531 Weakness: Secondary | ICD-10-CM

## 2016-05-18 DIAGNOSIS — E1142 Type 2 diabetes mellitus with diabetic polyneuropathy: Secondary | ICD-10-CM

## 2016-05-18 NOTE — Progress Notes (Signed)
Patient ID: Nyra Anspaugh, female   DOB: 1930/10/12, 83 y.o.   MRN: [id number]  Subjective: Briceyda Abdullah is a 83 y.o. Diabetic female patient seen today in office with complaint of painful thickened and elongated toenails; unable to trim. Patient denies any changes since last visit is assisted by living facility care giver. Patient has no other pedal complaints at this time just got over emesis episode from sausage at breakfast.   FBS not recorded   Patient Active Problem List   Diagnosis Date Noted  . COPD exacerbation (HCC)   . Dyspnea   . Hypoxia   . Acute respiratory failure with hypoxia (HCC)   . HCAP (healthcare-associated pneumonia) 11/17/2015  . Essential hypertension 11/17/2015  . Diabetes mellitus type 2, controlled (HCC) 11/17/2015  . OSA on CPAP 11/17/2015  . Pneumonia 11/17/2015    Current Outpatient Prescriptions on File Prior to Visit  Medication Sig Dispense Refill  . acetaminophen (TYLENOL) 650 MG CR tablet Take 650 mg by mouth every 12 (twelve) hours.     Marland Kitchen amLODipine (NORVASC) 10 MG tablet Take 0.5 tablets (5 mg total) by mouth daily.    Marland Kitchen aspirin EC 81 MG tablet Take 81 mg by mouth daily.    Marland Kitchen atorvastatin (LIPITOR) 10 MG tablet Take 10 mg by mouth at bedtime.     . Carboxymethylcellul-Glycerin (REFRESH OPTIVE) 1-0.9 % GEL Place 1 drop into both eyes 2 (two) times daily.    . cholecalciferol (VITAMIN D) 1000 units tablet Take 2,000 Units by mouth daily.    Marland Kitchen docusate sodium (COLACE) 100 MG capsule Take 100 mg by mouth 2 (two) times daily.    . DULoxetine (CYMBALTA) 60 MG capsule Take 60 mg by mouth daily.    . ferrous sulfate 325 (65 FE) MG tablet Take 325 mg by mouth daily with breakfast.    . furosemide (LASIX) 20 MG tablet Take 20 mg by mouth daily.     . insulin glargine (LANTUS) 100 UNIT/ML injection Inject 0.1 mLs (10 Units total) into the skin every 12 (twelve) hours. 10 mL 11  . levofloxacin (LEVAQUIN) 500 MG tablet Take 1 tablet (500 mg total) by mouth daily.  For 3 more days 3 tablet 0  . lisinopril (PRINIVIL,ZESTRIL) 10 MG tablet Take 2 tablets (20 mg total) by mouth daily.    . magnesium oxide (MAG-OX) 400 (241.3 Mg) MG tablet Take 400 mg by mouth daily.    . metFORMIN (GLUCOPHAGE) 1000 MG tablet Take 1,000 mg by mouth 2 (two) times daily with a meal.    . metoprolol succinate (TOPROL-XL) 50 MG 24 hr tablet Take 50-100 mg by mouth 2 (two) times daily. Pt takes two tablets in the morning and one tablet at bedtime.    Marland Kitchen omeprazole (PRILOSEC) 20 MG capsule Take 20 mg by mouth daily before breakfast.     . polyethylene glycol (MIRALAX / GLYCOLAX) packet Take 17 g by mouth at bedtime.    . senna (SENOKOT) 8.6 MG tablet Take 2 tablets by mouth at bedtime.      No current facility-administered medications on file prior to visit.     Allergies  Allergen Reactions  . Ampicillin Shortness Of Breath    Reaction:  Unknown  Has patient had a PCN reaction causing immediate rash, facial/tongue/throat swelling, SOB or lightheadedness with hypotension:  Unsure Has patient had a PCN reaction causing severe rash involving mucus membranes or skin necrosis: Unsure Has patient had a PCN reaction that required hospitalization Unsure Has  patient had a PCN reaction occurring within the last 10 years: Unsure If all of the above answers are "NO", then may proceed with Cephalosporin use.    Objective: Physical Exam  General: Well developed, nourished, no acute distress, awake, alert and oriented x 3 in wheelchair  Vascular: Dorsalis pedis artery 1/4 bilateral, Posterior tibial artery 0/4 bilateral, skin temperature warm to warm proximal to distal bilateral lower extremities, no varicosities, decreased pedal hair present bilateral.  Neurological: Gross sensation present via light touch bilateral.   Dermatological: Skin is warm, dry, and supple bilateral, Nails 1-10 are tender, long, thick, and discolored with mild subungal debris with spicules at hallux nail, no  webspace macerations present bilateral, no open lesions present bilateral, no callus/corns/hyperkeratotic tissue present bilateral. No signs of infection bilateral.  Musculoskeletal: Hammertoe deformities noted bilateral. Muscular strength decreased with left sided weakness without pain on range of motion. No pain with calf compression bilateral.  Assessment and Plan:  Problem List Items Addressed This Visit    None    Visit Diagnoses    Pain due to onychomycosis of toenail    -  Primary   Left-sided weakness       Diabetic peripheral neuropathy associated with type 2 diabetes mellitus (HCC)         -Examined patient.  -Discussed treatment options for painful mycotic nails. -Mechanically debrided and reduced mycotic nails with sterile nail nipper and dremel nail file without incident. -Advised daily skin emollients for dry skin -Encouraged daily foot inspection in the setting of diabetes  -Patient to return in 3 months for follow up evaluation or sooner if symptoms worsen.  Asencion Islamitorya Sharice Harriss, DPM

## 2016-08-17 ENCOUNTER — Encounter: Payer: Self-pay | Admitting: Sports Medicine

## 2016-08-17 ENCOUNTER — Ambulatory Visit (INDEPENDENT_AMBULATORY_CARE_PROVIDER_SITE_OTHER): Payer: Medicare Other | Admitting: Sports Medicine

## 2016-08-17 DIAGNOSIS — B351 Tinea unguium: Secondary | ICD-10-CM

## 2016-08-17 DIAGNOSIS — M79676 Pain in unspecified toe(s): Secondary | ICD-10-CM | POA: Diagnosis not present

## 2016-08-17 DIAGNOSIS — R531 Weakness: Secondary | ICD-10-CM

## 2016-08-17 DIAGNOSIS — E1142 Type 2 diabetes mellitus with diabetic polyneuropathy: Secondary | ICD-10-CM

## 2016-08-17 NOTE — Progress Notes (Signed)
Patient ID: Sara Schmidt, female   DOB: 06-Feb-1931, 83 y.o.   MRN: [id number]  Subjective: Sara Schmidt is a 83 y.o. Diabetic female patient seen today in office with complaint of painful thickened and elongated toenails; unable to trim. Patient denies any changes since last visit is assisted by living facility care giver from [facility]. No other issues.   FBS not recorded   Patient Active Problem List   Diagnosis Date Noted  . COPD exacerbation (HCC)   . Dyspnea   . Hypoxia   . Acute respiratory failure with hypoxia (HCC)   . HCAP (healthcare-associated pneumonia) 11/17/2015  . Essential hypertension 11/17/2015  . Diabetes mellitus type 2, controlled (HCC) 11/17/2015  . OSA on CPAP 11/17/2015  . Pneumonia 11/17/2015    Current Outpatient Prescriptions on File Prior to Visit  Medication Sig Dispense Refill  . acetaminophen (TYLENOL) 650 MG CR tablet Take 650 mg by mouth every 12 (twelve) hours.     Marland Kitchen amLODipine (NORVASC) 10 MG tablet Take 0.5 tablets (5 mg total) by mouth daily.    Marland Kitchen aspirin EC 81 MG tablet Take 81 mg by mouth daily.    Marland Kitchen atorvastatin (LIPITOR) 10 MG tablet Take 10 mg by mouth at bedtime.     . Carboxymethylcellul-Glycerin (REFRESH OPTIVE) 1-0.9 % GEL Place 1 drop into both eyes 2 (two) times daily.    . cholecalciferol (VITAMIN D) 1000 units tablet Take 2,000 Units by mouth daily.    Marland Kitchen docusate sodium (COLACE) 100 MG capsule Take 100 mg by mouth 2 (two) times daily.    . DULoxetine (CYMBALTA) 60 MG capsule Take 60 mg by mouth daily.    . ferrous sulfate 325 (65 FE) MG tablet Take 325 mg by mouth daily with breakfast.    . furosemide (LASIX) 20 MG tablet Take 20 mg by mouth daily.     . insulin glargine (LANTUS) 100 UNIT/ML injection Inject 0.1 mLs (10 Units total) into the skin every 12 (twelve) hours. 10 mL 11  . levofloxacin (LEVAQUIN) 500 MG tablet Take 1 tablet (500 mg total) by mouth daily. For 3 more days 3 tablet 0  . lisinopril (PRINIVIL,ZESTRIL) 10 MG  tablet Take 2 tablets (20 mg total) by mouth daily.    . magnesium oxide (MAG-OX) 400 (241.3 Mg) MG tablet Take 400 mg by mouth daily.    . metFORMIN (GLUCOPHAGE) 1000 MG tablet Take 1,000 mg by mouth 2 (two) times daily with a meal.    . metoprolol succinate (TOPROL-XL) 50 MG 24 hr tablet Take 50-100 mg by mouth 2 (two) times daily. Pt takes two tablets in the morning and one tablet at bedtime.    Marland Kitchen omeprazole (PRILOSEC) 20 MG capsule Take 20 mg by mouth daily before breakfast.     . polyethylene glycol (MIRALAX / GLYCOLAX) packet Take 17 g by mouth at bedtime.    . senna (SENOKOT) 8.6 MG tablet Take 2 tablets by mouth at bedtime.      No current facility-administered medications on file prior to visit.     Allergies  Allergen Reactions  . Ampicillin Shortness Of Breath    Reaction:  Unknown  Has patient had a PCN reaction causing immediate rash, facial/tongue/throat swelling, SOB or lightheadedness with hypotension:  Unsure Has patient had a PCN reaction causing severe rash involving mucus membranes or skin necrosis: Unsure Has patient had a PCN reaction that required hospitalization Unsure Has patient had a PCN reaction occurring within the last 10 years: Unsure  If all of the above answers are "NO", then may proceed with Cephalosporin use.    Objective: Physical Exam  General: Well developed, nourished, no acute distress, awake, alert and oriented x 3 in wheelchair  Vascular: Dorsalis pedis artery 1/4 bilateral, Posterior tibial artery 0/4 bilateral, skin temperature warm to warm proximal to distal bilateral lower extremities, no varicosities, decreased pedal hair present bilateral.  Neurological: Gross sensation present via light touch bilateral.   Dermatological: Skin is warm, dry, and supple bilateral, Nails 1-10 are tender, long, thick, and discolored with mild subungal debris with spicules at hallux nail, no webspace macerations present bilateral, no open lesions present  bilateral, no callus/corns/hyperkeratotic tissue present bilateral. No signs of infection bilateral.  Musculoskeletal: Hammertoe deformities noted bilateral. Muscular strength decreased with left sided weakness without pain on range of motion. No pain with calf compression bilateral.  Assessment and Plan:  Problem List Items Addressed This Visit    None    Visit Diagnoses    Pain due to onychomycosis of toenail    -  Primary   Diabetic peripheral neuropathy associated with type 2 diabetes mellitus (HCC)       Left-sided weakness         -Examined patient.  -Discussed treatment options for painful mycotic nails. -Mechanically debrided and reduced mycotic nails with sterile nail nipper and dremel nail file without incident. -Advised daily skin emollients for dry skin -Encouraged daily foot inspection in the setting of diabetes  -Patient to return in 3 months for follow up evaluation or sooner if symptoms worsen.  Asencion Islam, DPM

## 2016-11-16 ENCOUNTER — Ambulatory Visit: Payer: Medicare Other | Admitting: Sports Medicine

## 2016-11-30 ENCOUNTER — Ambulatory Visit (INDEPENDENT_AMBULATORY_CARE_PROVIDER_SITE_OTHER): Payer: Medicare Other | Admitting: Sports Medicine

## 2016-11-30 DIAGNOSIS — M79676 Pain in unspecified toe(s): Secondary | ICD-10-CM

## 2016-11-30 DIAGNOSIS — E1142 Type 2 diabetes mellitus with diabetic polyneuropathy: Secondary | ICD-10-CM | POA: Diagnosis not present

## 2016-11-30 DIAGNOSIS — B351 Tinea unguium: Secondary | ICD-10-CM | POA: Diagnosis not present

## 2016-11-30 DIAGNOSIS — R531 Weakness: Secondary | ICD-10-CM

## 2016-11-30 NOTE — Progress Notes (Signed)
Patient ID: Sara Schmidt, female   DOB: 02/20/1931, 83 y.o.   MRN: [id number]  Subjective: Sara Schmidt is a 83 y.o. Diabetic female patient seen today in office with complaint of painful thickened and elongated toenails; unable to trim. Patient denies any changes since last visit except some swelling on the left that has went away. Patient is assisted by living facility care giver from [facility]. No other issues.   FBS not recorded   Patient Active Problem List   Diagnosis Date Noted  . COPD exacerbation (HCC)   . Dyspnea   . Hypoxia   . Acute respiratory failure with hypoxia (HCC)   . HCAP (healthcare-associated pneumonia) 11/17/2015  . Essential hypertension 11/17/2015  . Diabetes mellitus type 2, controlled (HCC) 11/17/2015  . OSA on CPAP 11/17/2015  . Pneumonia 11/17/2015    Current Outpatient Prescriptions on File Prior to Visit  Medication Sig Dispense Refill  . acetaminophen (TYLENOL) 650 MG CR tablet Take 650 mg by mouth every 12 (twelve) hours.     Marland Kitchen amLODipine (NORVASC) 10 MG tablet Take 0.5 tablets (5 mg total) by mouth daily.    Marland Kitchen aspirin EC 81 MG tablet Take 81 mg by mouth daily.    Marland Kitchen atorvastatin (LIPITOR) 10 MG tablet Take 10 mg by mouth at bedtime.     . Carboxymethylcellul-Glycerin (REFRESH OPTIVE) 1-0.9 % GEL Place 1 drop into both eyes 2 (two) times daily.    . cholecalciferol (VITAMIN D) 1000 units tablet Take 2,000 Units by mouth daily.    Marland Kitchen docusate sodium (COLACE) 100 MG capsule Take 100 mg by mouth 2 (two) times daily.    . DULoxetine (CYMBALTA) 60 MG capsule Take 60 mg by mouth daily.    . ferrous sulfate 325 (65 FE) MG tablet Take 325 mg by mouth daily with breakfast.    . furosemide (LASIX) 20 MG tablet Take 20 mg by mouth daily.     . insulin glargine (LANTUS) 100 UNIT/ML injection Inject 0.1 mLs (10 Units total) into the skin every 12 (twelve) hours. 10 mL 11  . levofloxacin (LEVAQUIN) 500 MG tablet Take 1 tablet (500 mg total) by mouth daily. For 3  more days 3 tablet 0  . lisinopril (PRINIVIL,ZESTRIL) 10 MG tablet Take 2 tablets (20 mg total) by mouth daily.    . magnesium oxide (MAG-OX) 400 (241.3 Mg) MG tablet Take 400 mg by mouth daily.    . metFORMIN (GLUCOPHAGE) 1000 MG tablet Take 1,000 mg by mouth 2 (two) times daily with a meal.    . metoprolol succinate (TOPROL-XL) 50 MG 24 hr tablet Take 50-100 mg by mouth 2 (two) times daily. Pt takes two tablets in the morning and one tablet at bedtime.    Marland Kitchen omeprazole (PRILOSEC) 20 MG capsule Take 20 mg by mouth daily before breakfast.     . polyethylene glycol (MIRALAX / GLYCOLAX) packet Take 17 g by mouth at bedtime.    . senna (SENOKOT) 8.6 MG tablet Take 2 tablets by mouth at bedtime.      No current facility-administered medications on file prior to visit.     Allergies  Allergen Reactions  . Ampicillin Shortness Of Breath    Reaction:  Unknown  Has patient had a PCN reaction causing immediate rash, facial/tongue/throat swelling, SOB or lightheadedness with hypotension:  Unsure Has patient had a PCN reaction causing severe rash involving mucus membranes or skin necrosis: Unsure Has patient had a PCN reaction that required hospitalization Unsure Has patient  had a PCN reaction occurring within the last 10 years: Unsure If all of the above answers are "NO", then may proceed with Cephalosporin use.    Objective: Physical Exam  General: Well developed, nourished, no acute distress, awake, alert and oriented x 3 in wheelchair  Vascular: Dorsalis pedis artery 1/4 bilateral, Posterior tibial artery 0/4 bilateral, skin temperature warm to warm proximal to distal bilateral lower extremities, no varicosities, decreased pedal hair present bilateral.  Neurological: Gross sensation present via light touch bilateral.   Dermatological: Skin is warm, dry, and supple bilateral, Nails 1-10 are tender, long, thick, and discolored with mild subungal debris with spicules at hallux nail, no webspace  macerations present bilateral, no open lesions present bilateral, no callus/corns/hyperkeratotic tissue present bilateral. No signs of infection bilateral.  Musculoskeletal: Hammertoe deformities noted bilateral. Muscular strength decreased with left sided weakness without pain on range of motion. No pain with calf compression bilateral.  Assessment and Plan:  Problem List Items Addressed This Visit    None    Visit Diagnoses    Pain due to onychomycosis of toenail    -  Primary   Diabetic peripheral neuropathy associated with type 2 diabetes mellitus (HCC)       Left-sided weakness         -Examined patient.  -Discussed treatment options for painful mycotic nails. -Mechanically debrided and reduced mycotic nails with sterile nail nipper and dremel nail file without incident. -Encouraged daily foot inspection in the setting of diabetes  -Patient to return in 3 months for follow up evaluation or sooner if symptoms worsen.  Asencion Islamitorya Harvest Stanco, DPM

## 2017-03-08 ENCOUNTER — Encounter: Payer: Self-pay | Admitting: Sports Medicine

## 2017-03-08 ENCOUNTER — Ambulatory Visit (INDEPENDENT_AMBULATORY_CARE_PROVIDER_SITE_OTHER): Payer: Medicare Other | Admitting: Sports Medicine

## 2017-03-08 DIAGNOSIS — E1142 Type 2 diabetes mellitus with diabetic polyneuropathy: Secondary | ICD-10-CM

## 2017-03-08 DIAGNOSIS — R531 Weakness: Secondary | ICD-10-CM | POA: Diagnosis not present

## 2017-03-08 DIAGNOSIS — M79676 Pain in unspecified toe(s): Secondary | ICD-10-CM | POA: Diagnosis not present

## 2017-03-08 DIAGNOSIS — B351 Tinea unguium: Secondary | ICD-10-CM | POA: Diagnosis not present

## 2017-03-08 NOTE — Progress Notes (Signed)
Patient ID: Sara Schmidt, female   DOB: Apr 25, 1931, 83 y.o.   MRN: [id number]  Subjective: Sara Schmidt is a 83 y.o. Diabetic female patient seen today in office with complaint of painful thickened and elongated toenails; unable to trim. Patient denies any changes since last visit. [facility] No other issues.   FBS not recorded   Patient Active Problem List   Diagnosis Date Noted  . COPD exacerbation (HCC)   . Dyspnea   . Hypoxia   . Acute respiratory failure with hypoxia (HCC)   . HCAP (healthcare-associated pneumonia) 11/17/2015  . Essential hypertension 11/17/2015  . Diabetes mellitus type 2, controlled (HCC) 11/17/2015  . OSA on CPAP 11/17/2015  . Pneumonia 11/17/2015    Current Outpatient Medications on File Prior to Visit  Medication Sig Dispense Refill  . acetaminophen (TYLENOL) 650 MG CR tablet Take 650 mg by mouth every 12 (twelve) hours.     Marland Kitchen. amLODipine (NORVASC) 10 MG tablet Take 0.5 tablets (5 mg total) by mouth daily.    Marland Kitchen. aspirin EC 81 MG tablet Take 81 mg by mouth daily.    Marland Kitchen. atorvastatin (LIPITOR) 10 MG tablet Take 10 mg by mouth at bedtime.     . Carboxymethylcellul-Glycerin (REFRESH OPTIVE) 1-0.9 % GEL Place 1 drop into both eyes 2 (two) times daily.    . cholecalciferol (VITAMIN D) 1000 units tablet Take 2,000 Units by mouth daily.    Marland Kitchen. docusate sodium (COLACE) 100 MG capsule Take 100 mg by mouth 2 (two) times daily.    . DULoxetine (CYMBALTA) 60 MG capsule Take 60 mg by mouth daily.    . ferrous sulfate 325 (65 FE) MG tablet Take 325 mg by mouth daily with breakfast.    . furosemide (LASIX) 20 MG tablet Take 20 mg by mouth daily.     . insulin glargine (LANTUS) 100 UNIT/ML injection Inject 0.1 mLs (10 Units total) into the skin every 12 (twelve) hours. 10 mL 11  . levofloxacin (LEVAQUIN) 500 MG tablet Take 1 tablet (500 mg total) by mouth daily. For 3 more days 3 tablet 0  . lisinopril (PRINIVIL,ZESTRIL)  10 MG tablet Take 2 tablets (20 mg total) by mouth daily.    . magnesium oxide (MAG-OX) 400 (241.3 Mg) MG tablet Take 400 mg by mouth daily.    . metFORMIN (GLUCOPHAGE) 1000 MG tablet Take 1,000 mg by mouth 2 (two) times daily with a meal.    . metoprolol succinate (TOPROL-XL) 50 MG 24 hr tablet Take 50-100 mg by mouth 2 (two) times daily. Pt takes two tablets in the morning and one tablet at bedtime.    Marland Kitchen. omeprazole (PRILOSEC) 20 MG capsule Take 20 mg by mouth daily before breakfast.     . polyethylene glycol (MIRALAX / GLYCOLAX) packet Take 17 g by mouth at bedtime.    . senna (SENOKOT) 8.6 MG tablet Take 2 tablets by mouth at bedtime.      No current facility-administered medications on file prior to visit.     Allergies  Allergen Reactions  . Ampicillin Shortness Of Breath    Reaction:  Unknown  Has patient had a PCN reaction causing immediate rash, facial/tongue/throat swelling, SOB or lightheadedness with hypotension:  Unsure Has patient had a PCN reaction causing severe rash involving mucus membranes or skin necrosis: Unsure Has patient had a PCN reaction that required hospitalization Unsure Has patient had a PCN reaction occurring within the last 10 years:  Unsure If all of the above answers are "NO", then may proceed with Cephalosporin use.    Objective: Physical Exam  General: Well developed, nourished, no acute distress, awake, alert and oriented x 3 in wheelchair  Vascular: Dorsalis pedis artery 1/4 bilateral, Posterior tibial artery 0/4 bilateral, skin temperature warm to warm proximal to distal bilateral lower extremities, mild edema, no varicosities, decreased pedal hair present bilateral.  Neurological: Gross sensation present via light touch bilateral.   Dermatological: Skin is warm, dry, and supple bilateral, Nails 1-10 are tender, long, thick, and discolored with mild subungal debris with spicules at hallux nail, no webspace macerations present bilateral, no open  lesions present bilateral, no callus/corns/hyperkeratotic tissue present bilateral. No signs of infection bilateral.  Musculoskeletal: Hammertoe deformities noted bilateral. Muscular strength decreased with left sided weakness without pain on range of motion. No pain with calf compression bilateral.  Assessment and Plan:  Problem List Items Addressed This Visit    None    Visit Diagnoses    Pain due to onychomycosis of toenail    -  Primary   Diabetic peripheral neuropathy associated with type 2 diabetes mellitus (HCC)       Left-sided weakness         -Examined patient.  -Discussed treatment options for painful mycotic nails. -Mechanically debrided and reduced mycotic nails with sterile nail nipper and dremel nail file without incident. -Encouraged daily foot inspection in the setting of diabetes and elevation for edema control  -Patient to return in 3 months for follow up evaluation or sooner if symptoms worsen.  Sara Schmidt, DPM

## 2017-05-22 IMAGING — CT CT ANGIO CHEST
3 of 7 series · 18 of 36 positions shown · IV contrast (isovue)
Comparison: Chest radiograph November 17, 2015

CLINICAL DATA: Chest pain with cough ; shortness of breath

EXAM:
CT ANGIOGRAPHY CHEST WITH CONTRAST
TECHNIQUE: Multidetector CT imaging of the chest was performed using the
standard protocol during bolus administration of intravenous
contrast. Multiplanar CT image reconstructions and MIPs were
obtained to evaluate the vascular anatomy.
CONTRAST:  100 mL Isovue 370 nonionic

[Series 8: pe thins @ 1mm · axial · 0.78mm/px · z∈[-296,-56]mm · 13 of 280 slices shown]
[im 20/280  lung]
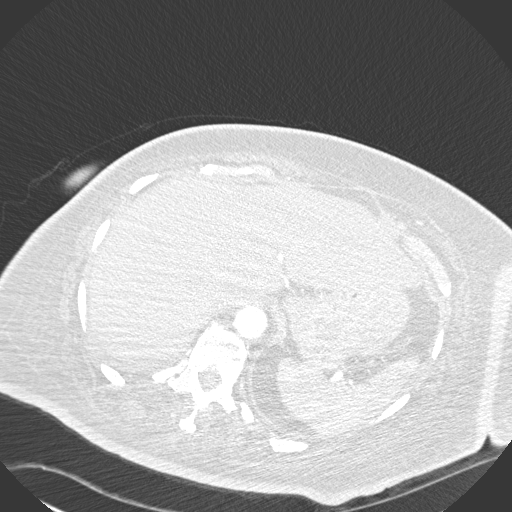
[im 40/280  mediastinal]
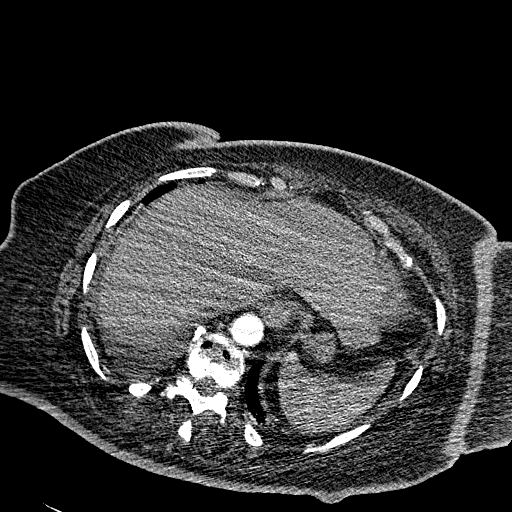
[im 60/280  lung]
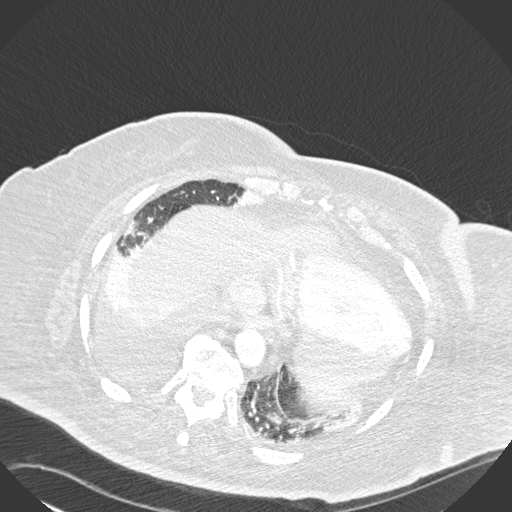
[im 80/280  mediastinal]
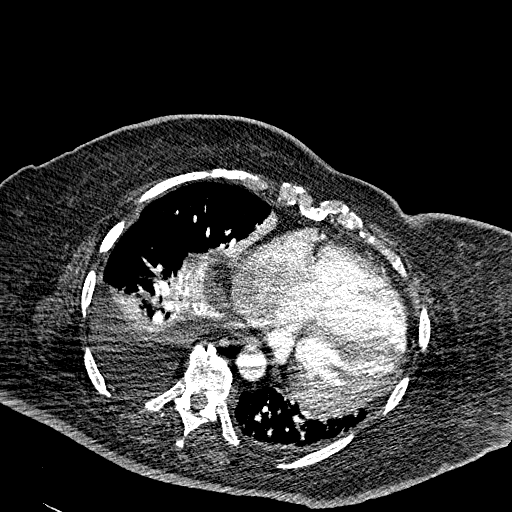
[im 100/280  lung]
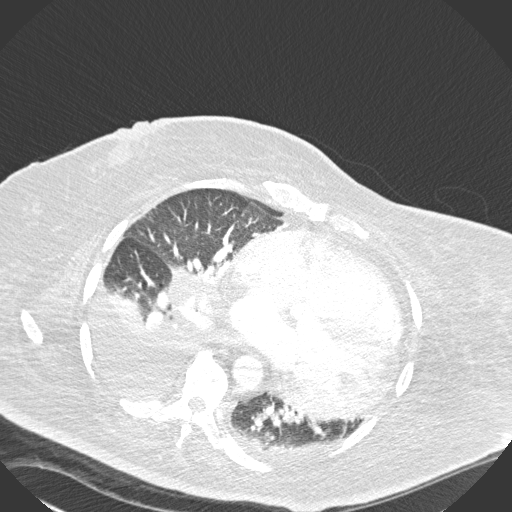
[im 120/280  mediastinal]
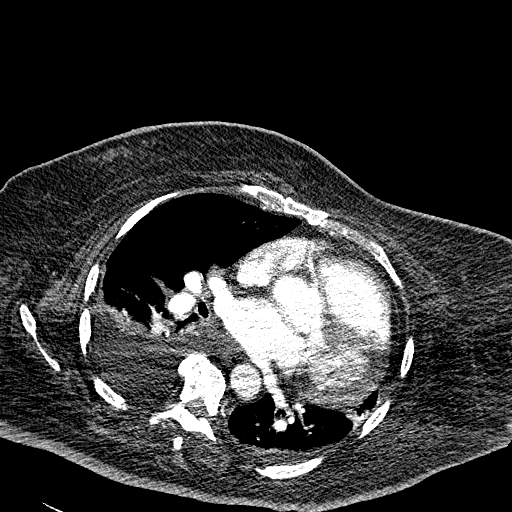
[im 140/280  lung]
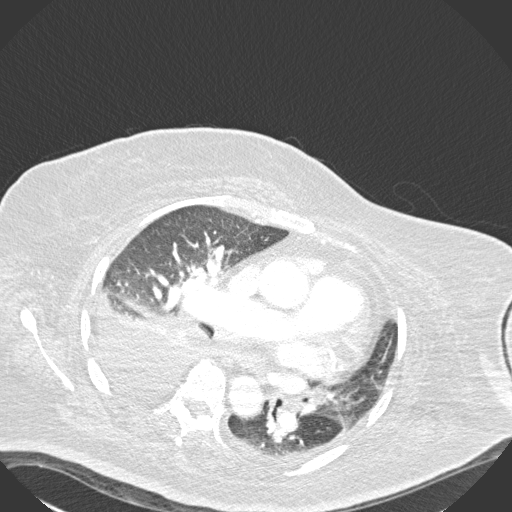
[im 160/280  mediastinal]
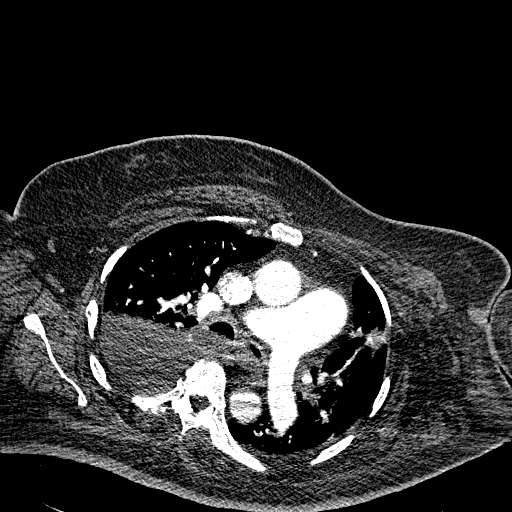
[im 180/280  lung]
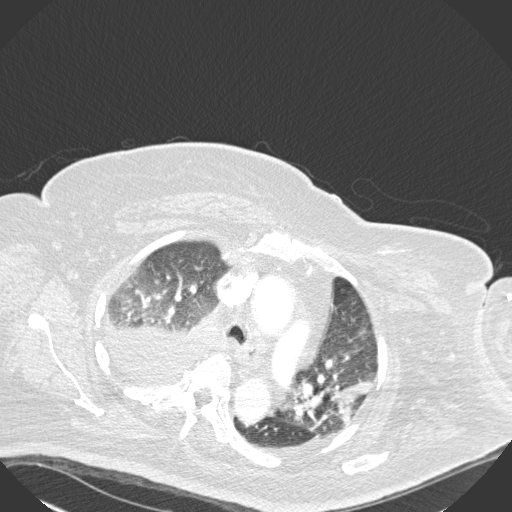
[im 200/280  mediastinal]
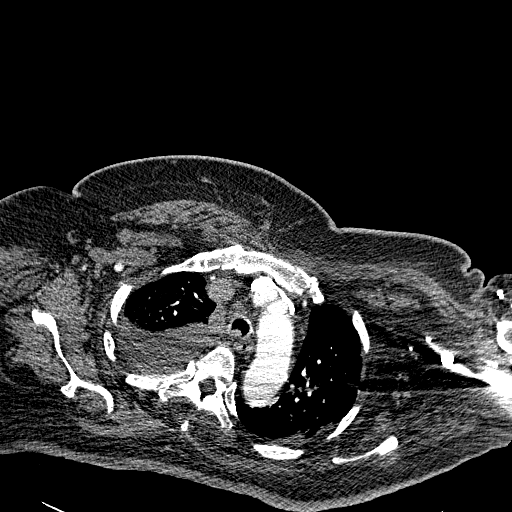
[im 220/280  lung]
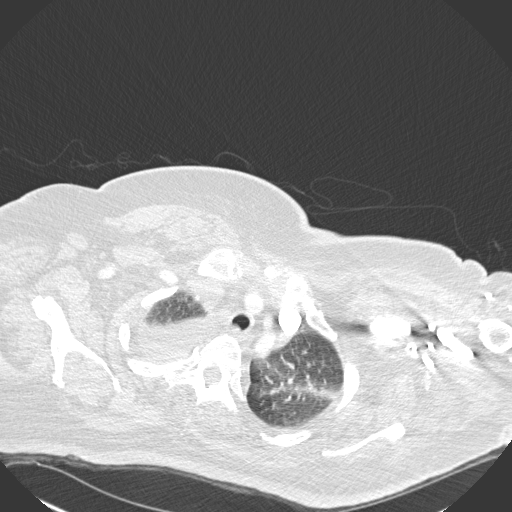
[im 240/280  mediastinal]
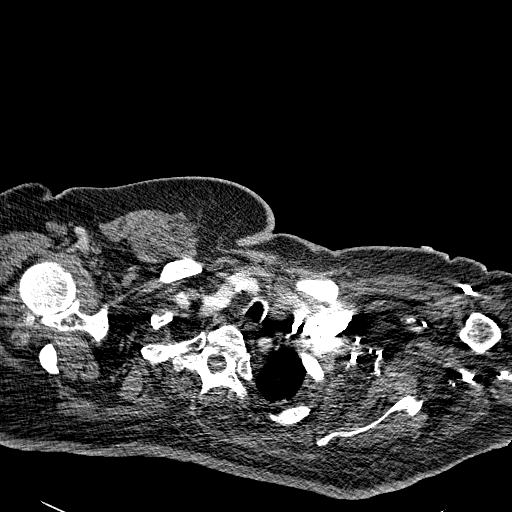
[im 260/280  lung]
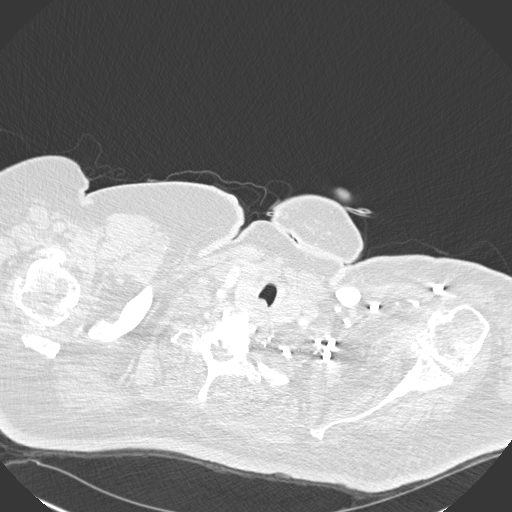

[Series 9: lung windows · axial · 0.78mm/px · z∈[-276,-156]mm · 4 of 140 slices shown]
[im 20/140  mediastinal]
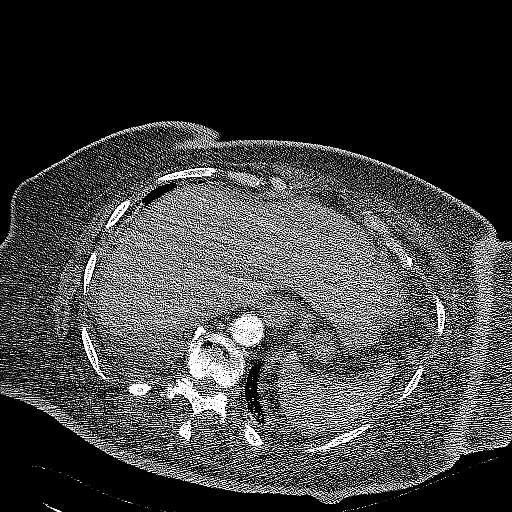
[im 40/140  mediastinal]
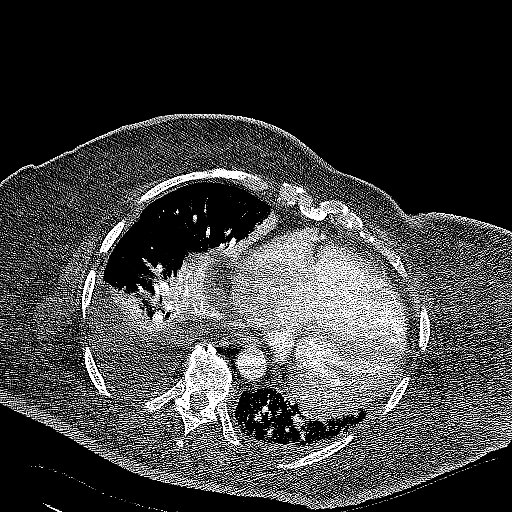
[im 60/140  mediastinal]
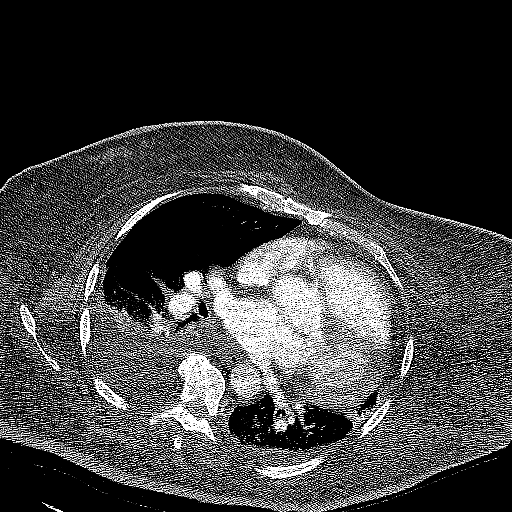
[im 80/140  mediastinal]
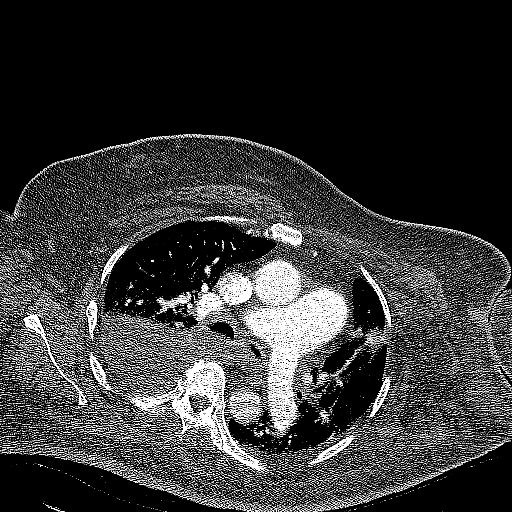

[Series 602: coronal mpr · coronal · 0.78mm/px · 1 of 148 slices shown]
[im 74/148  mediastinal]
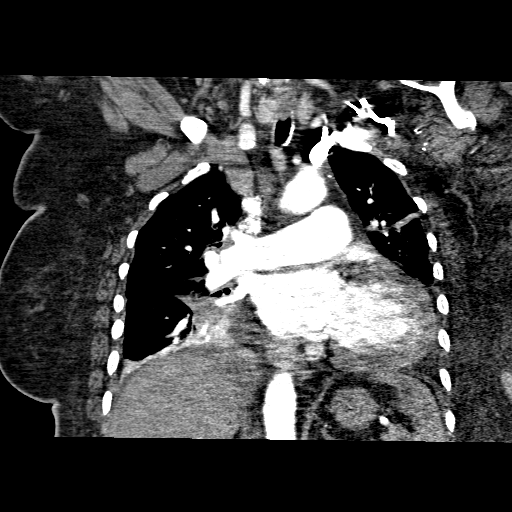

[18 of 36 positions shown; findings below may reference images not displayed]

FINDINGS: Cardiovascular: There is no demonstrable pulmonary embolus. There is
no thoracic aortic aneurysm or dissection. There are scattered foci
of calcification in the thoracic aorta. The visualized great vessels
appear unremarkable. Heart is enlarged. Pericardium is not
appreciably thickened.

Mediastinum/Nodes: There is a benign-appearing calcification along
the anterior aspect of the right lobe of the thyroid measuring 7 x 5
mm. Thyroid appears inhomogeneous and attenuation, likely due to
multinodular goiter. There is no appreciable thoracic adenopathy.

Lungs/Pleura: There is a sizable right pleural effusion with a much
smaller left pleural effusion. There is interstitial edema lower
lobes. There is airspace consolidation in the right lower lobe and
medial segment right middle lobe. There is patchy atelectasis in the
left upper lobe. There is a focal area of consolidation in the
lingula.

Upper Abdomen: Visualized upper abdominal structures are
unremarkable.

Musculoskeletal: There is degenerative change in the thoracic spine
and in each shoulder. There are no blastic or lytic bone lesions.

Review of the MIP images confirms the above findings.
IMPRESSION: No demonstrable pulmonary embolus.

There is cardiomegaly with bilateral pleural effusions, larger on
the right than on the left, we underlying interstitial edema. These
findings are felt to represent a degree of congestive heart failure.

Areas of airspace consolidation in the right lower lobe, right
middle lobe, and lingula, felt to represent superimposed multifocal
pneumonia.

No adenopathy.

Multinodular goiter.

## 2017-05-23 IMAGING — DX DG CHEST 1V PORT
1 series · 1 of 1 positions shown · non-contrast
Comparison: 11/19/2015.  CT 11/19/2015 .

CLINICAL DATA: Respiratory failure.

EXAM:
PORTABLE CHEST 1 VIEW

[chest ap]
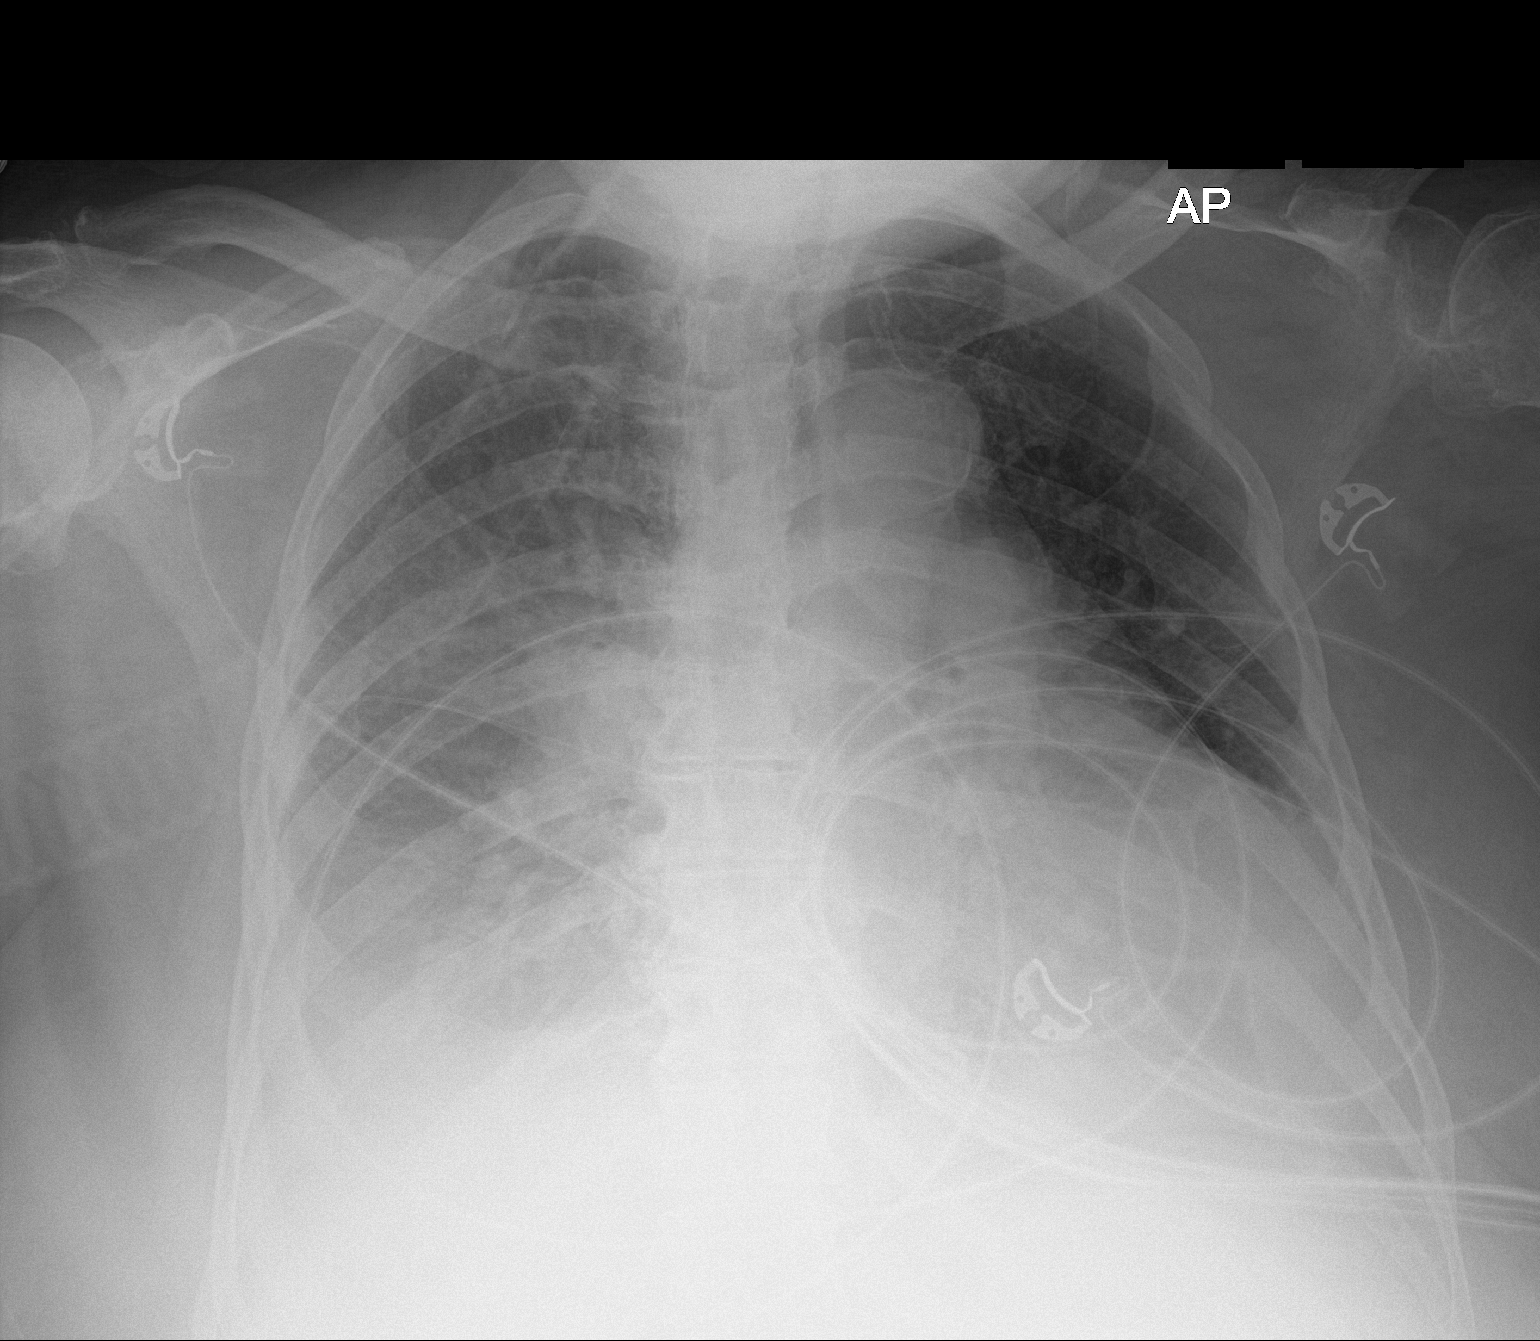

[1 of 1 positions shown; findings below may reference images not displayed]

FINDINGS: Cardiomegaly with bilateral pulmonary alveolar infiltrates and
bilateral pleural effusions. Slight worsening from prior exam.
Findings consistent congestive heart failure. Bilateral pneumonia
cannot be excluded. No pneumothorax.
IMPRESSION: Persistent cardiomegaly with bilateral pulmonary infiltrates and
pleural effusions. Slight worsening from prior exam. Findings
consistent congestive heart failure . Bilateral pneumonia cannot be
excluded .

## 2017-06-14 ENCOUNTER — Encounter: Payer: Self-pay | Admitting: Sports Medicine

## 2017-06-14 ENCOUNTER — Ambulatory Visit (INDEPENDENT_AMBULATORY_CARE_PROVIDER_SITE_OTHER): Payer: Medicare Other | Admitting: Sports Medicine

## 2017-06-14 DIAGNOSIS — B351 Tinea unguium: Secondary | ICD-10-CM

## 2017-06-14 DIAGNOSIS — E1142 Type 2 diabetes mellitus with diabetic polyneuropathy: Secondary | ICD-10-CM | POA: Diagnosis not present

## 2017-06-14 DIAGNOSIS — M79676 Pain in unspecified toe(s): Secondary | ICD-10-CM | POA: Diagnosis not present

## 2017-06-14 DIAGNOSIS — R531 Weakness: Secondary | ICD-10-CM

## 2017-06-14 NOTE — Progress Notes (Signed)
Patient ID: Sara Schmidt, female   DOB: Apr 25, 1931, 84 y.o.   MRN: [id number]  Subjective: Sara LimesJoyce Lukens is a 84 y.o. Diabetic female patient seen today in office with complaint of painful thickened and elongated toenails; unable to trim. Patient denies any changes since last visit. Patient is [facility]. No other issues.   FBS not recorded   Patient Active Problem List   Diagnosis Date Noted  . COPD exacerbation (HCC)   . Dyspnea   . Hypoxia   . Acute respiratory failure with hypoxia (HCC)   . HCAP (healthcare-associated pneumonia) 11/17/2015  . Essential hypertension 11/17/2015  . Diabetes mellitus type 2, controlled (HCC) 11/17/2015  . OSA on CPAP 11/17/2015  . Pneumonia 11/17/2015    Current Outpatient Medications on File Prior to Visit  Medication Sig Dispense Refill  . acetaminophen (TYLENOL) 650 MG CR tablet Take 650 mg by mouth every 12 (twelve) hours.     Marland Kitchen. amLODipine (NORVASC) 10 MG tablet Take 0.5 tablets (5 mg total) by mouth daily.    Marland Kitchen. aspirin EC 81 MG tablet Take 81 mg by mouth daily.    Marland Kitchen. atorvastatin (LIPITOR) 10 MG tablet Take 10 mg by mouth at bedtime.     . Carboxymethylcellul-Glycerin (REFRESH OPTIVE) 1-0.9 % GEL Place 1 drop into both eyes 2 (two) times daily.    . cholecalciferol (VITAMIN D) 1000 units tablet Take 2,000 Units by mouth daily.    Marland Kitchen. docusate sodium (COLACE) 100 MG capsule Take 100 mg by mouth 2 (two) times daily.    . DULoxetine (CYMBALTA) 60 MG capsule Take 60 mg by mouth daily.    . ferrous sulfate 325 (65 FE) MG tablet Take 325 mg by mouth daily with breakfast.    . furosemide (LASIX) 20 MG tablet Take 20 mg by mouth daily.     . insulin glargine (LANTUS) 100 UNIT/ML injection Inject 0.1 mLs (10 Units total) into the skin every 12 (twelve) hours. 10 mL 11  . levofloxacin (LEVAQUIN) 500 MG tablet Take 1 tablet (500 mg total) by mouth daily. For 3 more days 3 tablet 0  . lisinopril (PRINIVIL,ZESTRIL)  10 MG tablet Take 2 tablets (20 mg total) by mouth daily.    . magnesium oxide (MAG-OX) 400 (241.3 Mg) MG tablet Take 400 mg by mouth daily.    . metFORMIN (GLUCOPHAGE) 1000 MG tablet Take 1,000 mg by mouth 2 (two) times daily with a meal.    . metoprolol succinate (TOPROL-XL) 50 MG 24 hr tablet Take 50-100 mg by mouth 2 (two) times daily. Pt takes two tablets in the morning and one tablet at bedtime.    Marland Kitchen. omeprazole (PRILOSEC) 20 MG capsule Take 20 mg by mouth daily before breakfast.     . polyethylene glycol (MIRALAX / GLYCOLAX) packet Take 17 g by mouth at bedtime.    . senna (SENOKOT) 8.6 MG tablet Take 2 tablets by mouth at bedtime.      No current facility-administered medications on file prior to visit.     Allergies  Allergen Reactions  . Ampicillin Shortness Of Breath    Reaction:  Unknown  Has patient had a PCN reaction causing immediate rash, facial/tongue/throat swelling, SOB or lightheadedness with hypotension:  Unsure Has patient had a PCN reaction causing severe rash involving mucus membranes or skin necrosis: Unsure Has patient had a PCN reaction that required hospitalization Unsure Has patient had a PCN reaction occurring within the last 10 years:  Unsure If all of the above answers are "NO", then may proceed with Cephalosporin use.    Objective: Physical Exam  General: Well developed, nourished, no acute distress, awake, alert and oriented x 3 in wheelchair  Vascular: Dorsalis pedis artery 1/4 bilateral, Posterior tibial artery 0/4 bilateral, skin temperature warm to warm proximal to distal bilateral lower extremities, mild edema, no varicosities, decreased pedal hair present bilateral.  Neurological: Gross sensation present via light touch bilateral.   Dermatological: Skin is warm, dry, and supple bilateral, Nails 1-10 are tender, long, thick, and discolored with mild subungal debris with spicules at hallux nail, no webspace macerations present bilateral, no open  lesions present bilateral, no callus/corns/hyperkeratotic tissue present bilateral. No signs of infection bilateral.  Musculoskeletal: Hammertoe deformities noted bilateral. Muscular strength decreased with left sided weakness without pain on range of motion. No pain with calf compression bilateral.  Assessment and Plan:  Problem List Items Addressed This Visit    None    Visit Diagnoses    Pain due to onychomycosis of toenail    -  Primary   Diabetic peripheral neuropathy associated with type 2 diabetes mellitus (HCC)       Left-sided weakness         -Examined patient.  -Discussed treatment options for painful mycotic nails. -Mechanically debrided and reduced mycotic nails with sterile nail nipper and dremel nail file without incident. -Encouraged daily foot inspection in the setting of diabetes and elevation for edema control  -Patient to return in 3 months for follow up evaluation or sooner if symptoms worsen.  Asencion Islamitorya Trendon Zaring, DPM

## 2017-09-13 ENCOUNTER — Ambulatory Visit: Payer: Medicare Other | Admitting: Sports Medicine

## 2018-06-06 ENCOUNTER — Inpatient Hospital Stay (HOSPITAL_COMMUNITY)
Admission: EM | Admit: 2018-06-06 | Discharge: 2018-06-13 | DRG: 871 | Disposition: A | Discharge status: Skilled Nursing Facility | Payer: Medicare Other | Attending: Internal Medicine | Admitting: Internal Medicine

## 2018-06-06 ENCOUNTER — Emergency Department (HOSPITAL_COMMUNITY): Payer: Medicare Other

## 2018-06-06 ENCOUNTER — Encounter (HOSPITAL_COMMUNITY): Payer: Self-pay | Admitting: Emergency Medicine

## 2018-06-06 ENCOUNTER — Other Ambulatory Visit: Payer: Self-pay

## 2018-06-06 DIAGNOSIS — E119 Type 2 diabetes mellitus without complications: Secondary | ICD-10-CM

## 2018-06-06 DIAGNOSIS — G629 Polyneuropathy, unspecified: Secondary | ICD-10-CM | POA: Diagnosis present

## 2018-06-06 DIAGNOSIS — I959 Hypotension, unspecified: Secondary | ICD-10-CM | POA: Diagnosis present

## 2018-06-06 DIAGNOSIS — M542 Cervicalgia: Secondary | ICD-10-CM

## 2018-06-06 DIAGNOSIS — E785 Hyperlipidemia, unspecified: Secondary | ICD-10-CM | POA: Diagnosis present

## 2018-06-06 DIAGNOSIS — J90 Pleural effusion, not elsewhere classified: Secondary | ICD-10-CM

## 2018-06-06 DIAGNOSIS — D649 Anemia, unspecified: Secondary | ICD-10-CM | POA: Diagnosis present

## 2018-06-06 DIAGNOSIS — K219 Gastro-esophageal reflux disease without esophagitis: Secondary | ICD-10-CM | POA: Diagnosis present

## 2018-06-06 DIAGNOSIS — Z515 Encounter for palliative care: Secondary | ICD-10-CM

## 2018-06-06 DIAGNOSIS — J9601 Acute respiratory failure with hypoxia: Secondary | ICD-10-CM | POA: Diagnosis present

## 2018-06-06 DIAGNOSIS — J9621 Acute and chronic respiratory failure with hypoxia: Secondary | ICD-10-CM | POA: Diagnosis present

## 2018-06-06 DIAGNOSIS — J9620 Acute and chronic respiratory failure, unspecified whether with hypoxia or hypercapnia: Secondary | ICD-10-CM

## 2018-06-06 DIAGNOSIS — L89152 Pressure ulcer of sacral region, stage 2: Secondary | ICD-10-CM | POA: Diagnosis present

## 2018-06-06 DIAGNOSIS — J440 Chronic obstructive pulmonary disease with acute lower respiratory infection: Secondary | ICD-10-CM | POA: Diagnosis present

## 2018-06-06 DIAGNOSIS — J918 Pleural effusion in other conditions classified elsewhere: Secondary | ICD-10-CM | POA: Diagnosis present

## 2018-06-06 DIAGNOSIS — R0600 Dyspnea, unspecified: Secondary | ICD-10-CM | POA: Diagnosis not present

## 2018-06-06 DIAGNOSIS — Z833 Family history of diabetes mellitus: Secondary | ICD-10-CM

## 2018-06-06 DIAGNOSIS — N179 Acute kidney failure, unspecified: Secondary | ICD-10-CM

## 2018-06-06 DIAGNOSIS — G4733 Obstructive sleep apnea (adult) (pediatric): Secondary | ICD-10-CM | POA: Diagnosis present

## 2018-06-06 DIAGNOSIS — L8990 Pressure ulcer of unspecified site, unspecified stage: Secondary | ICD-10-CM

## 2018-06-06 DIAGNOSIS — J189 Pneumonia, unspecified organism: Secondary | ICD-10-CM | POA: Diagnosis present

## 2018-06-06 DIAGNOSIS — Z993 Dependence on wheelchair: Secondary | ICD-10-CM

## 2018-06-06 DIAGNOSIS — R4182 Altered mental status, unspecified: Secondary | ICD-10-CM

## 2018-06-06 DIAGNOSIS — F0390 Unspecified dementia without behavioral disturbance: Secondary | ICD-10-CM | POA: Diagnosis present

## 2018-06-06 DIAGNOSIS — A419 Sepsis, unspecified organism: Principal | ICD-10-CM | POA: Diagnosis present

## 2018-06-06 DIAGNOSIS — J9622 Acute and chronic respiratory failure with hypercapnia: Secondary | ICD-10-CM | POA: Diagnosis present

## 2018-06-06 DIAGNOSIS — Y95 Nosocomial condition: Secondary | ICD-10-CM | POA: Diagnosis present

## 2018-06-06 DIAGNOSIS — Z794 Long term (current) use of insulin: Secondary | ICD-10-CM

## 2018-06-06 DIAGNOSIS — E11649 Type 2 diabetes mellitus with hypoglycemia without coma: Secondary | ICD-10-CM | POA: Diagnosis present

## 2018-06-06 DIAGNOSIS — F329 Major depressive disorder, single episode, unspecified: Secondary | ICD-10-CM | POA: Diagnosis present

## 2018-06-06 DIAGNOSIS — Z7982 Long term (current) use of aspirin: Secondary | ICD-10-CM

## 2018-06-06 DIAGNOSIS — I69354 Hemiplegia and hemiparesis following cerebral infarction affecting left non-dominant side: Secondary | ICD-10-CM

## 2018-06-06 DIAGNOSIS — Z79899 Other long term (current) drug therapy: Secondary | ICD-10-CM

## 2018-06-06 DIAGNOSIS — Z66 Do not resuscitate: Secondary | ICD-10-CM | POA: Diagnosis present

## 2018-06-06 DIAGNOSIS — I5033 Acute on chronic diastolic (congestive) heart failure: Secondary | ICD-10-CM | POA: Diagnosis present

## 2018-06-06 LAB — CBC WITH DIFFERENTIAL/PLATELET
Abs Immature Granulocytes: 0.04 10*3/uL (ref 0.00–0.07)
BASOS ABS: 0 10*3/uL (ref 0.0–0.1)
Basophils Relative: 0 %
EOS ABS: 0.2 10*3/uL (ref 0.0–0.5)
Eosinophils Relative: 3 %
HEMATOCRIT: 35.8 % — AB (ref 36.0–46.0)
Hemoglobin: 10.2 g/dL — ABNORMAL LOW (ref 12.0–15.0)
IMMATURE GRANULOCYTES: 1 %
LYMPHS ABS: 1.4 10*3/uL (ref 0.7–4.0)
Lymphocytes Relative: 23 %
MCH: 26.5 pg (ref 26.0–34.0)
MCHC: 28.5 g/dL — ABNORMAL LOW (ref 30.0–36.0)
MCV: 93 fL (ref 80.0–100.0)
Monocytes Absolute: 0.9 10*3/uL (ref 0.1–1.0)
Monocytes Relative: 14 %
NEUTROS PCT: 59 %
NRBC: 0.8 % — AB (ref 0.0–0.2)
Neutro Abs: 3.5 10*3/uL (ref 1.7–7.7)
PLATELETS: 176 10*3/uL (ref 150–400)
RBC: 3.85 MIL/uL — AB (ref 3.87–5.11)
RDW: 16 % — AB (ref 11.5–15.5)
WBC: 6 10*3/uL (ref 4.0–10.5)

## 2018-06-06 LAB — POCT I-STAT 7, (LYTES, BLD GAS, ICA,H+H)
Acid-Base Excess: 11 mmol/L — ABNORMAL HIGH (ref 0.0–2.0)
BICARBONATE: 40.7 mmol/L — AB (ref 20.0–28.0)
Calcium, Ion: 1.2 mmol/L (ref 1.15–1.40)
HEMATOCRIT: 33 % — AB (ref 36.0–46.0)
HEMOGLOBIN: 11.2 g/dL — AB (ref 12.0–15.0)
O2 SAT: 89 %
PH ART: 7.29 — AB (ref 7.350–7.450)
PO2 ART: 67 mmHg — AB (ref 83.0–108.0)
Patient temperature: 98.4
Potassium: 4.2 mmol/L (ref 3.5–5.1)
Sodium: 143 mmol/L (ref 135–145)
TCO2: 43 mmol/L — ABNORMAL HIGH (ref 22–32)
pCO2 arterial: 84.5 mmHg (ref 32.0–48.0)

## 2018-06-06 LAB — COMPREHENSIVE METABOLIC PANEL
ALBUMIN: 3.2 g/dL — AB (ref 3.5–5.0)
ALT: 15 U/L (ref 0–44)
AST: 17 U/L (ref 15–41)
Alkaline Phosphatase: 28 U/L — ABNORMAL LOW (ref 38–126)
Anion gap: 10 (ref 5–15)
BILIRUBIN TOTAL: 0.9 mg/dL (ref 0.3–1.2)
BUN: 29 mg/dL — ABNORMAL HIGH (ref 8–23)
CHLORIDE: 97 mmol/L — AB (ref 98–111)
CO2: 36 mmol/L — ABNORMAL HIGH (ref 22–32)
Calcium: 8.7 mg/dL — ABNORMAL LOW (ref 8.9–10.3)
Creatinine, Ser: 1.19 mg/dL — ABNORMAL HIGH (ref 0.44–1.00)
GFR calc Af Amer: 48 mL/min — ABNORMAL LOW (ref >60)
GFR, EST NON AFRICAN AMERICAN: 42 mL/min — AB (ref >60)
Glucose, Bld: 142 mg/dL — ABNORMAL HIGH (ref 70–99)
POTASSIUM: 4.2 mmol/L (ref 3.5–5.1)
SODIUM: 143 mmol/L (ref 135–145)
Total Protein: 5.9 g/dL — ABNORMAL LOW (ref 6.5–8.1)

## 2018-06-06 LAB — LACTIC ACID, PLASMA
LACTIC ACID, VENOUS: 1.5 mmol/L (ref 0.5–1.9)
Lactic Acid, Venous: 2.1 mmol/L (ref 0.5–1.9)

## 2018-06-06 MED ORDER — SODIUM CHLORIDE 0.9 % IV SOLN
INTRAVENOUS | Status: DC
  Administered 2018-06-06 – 2018-06-11 (×4): via INTRAVENOUS

## 2018-06-06 MED ORDER — FUROSEMIDE 10 MG/ML IJ SOLN
20.0000 mg | Freq: Once | INTRAMUSCULAR | Status: AC
  Administered 2018-06-06: 20 mg via INTRAVENOUS
  Filled 2018-06-06: qty 2

## 2018-06-06 NOTE — ED Triage Notes (Signed)
Pt to ED via [facility] from [facility] with c/o shortness of breath.  Pt was recently dx with pneumonia and is currently being tx with IV antibiotics.

## 2018-06-06 NOTE — ED Provider Notes (Signed)
[facility] EMERGENCY DEPARTMENT Provider Note   CSN: [id number] Arrival date & time: 06/06/18  1937     History   Chief Complaint No chief complaint on file.   HPI Sara Schmidt is a 85 y.o. female.  85 year old female with history of COPD and recently she has been diagnosed with pneumonia and on IV antibiotics who presents with worsening dyspnea as well as decreased O2 sats.  No reported cough.  She is also been not as alert as normal.  EMS called and patient sats were in the 80s and she was placed nonrebreather and transported here     Past Medical History:  Diagnosis Date  . Depression   . Diabetes (HCC)   . Hemiplegia [facility])     Patient Active Problem List   Diagnosis Date Noted  . COPD exacerbation (HCC)   . Dyspnea   . Hypoxia   . Acute respiratory failure with hypoxia (HCC)   . HCAP (healthcare-associated pneumonia) 11/17/2015  . Essential hypertension 11/17/2015  . Diabetes mellitus type 2, controlled (HCC) 11/17/2015  . OSA on CPAP 11/17/2015  . Pneumonia 11/17/2015    No past surgical history on file.   OB History   No obstetric history on file.      Home Medications    Prior to Admission medications   Medication Sig Start Date End Date Taking? Authorizing Provider  acetaminophen (TYLENOL) 650 MG CR tablet Take 650 mg by mouth every 12 (twelve) hours.     [provider]  amLODipine (NORVASC) 10 MG tablet Take 0.5 tablets (5 mg total) by mouth daily. 11/22/15   Leatha Gilding, MD  aspirin EC 81 MG tablet Take 81 mg by mouth daily.    [provider]  atorvastatin (LIPITOR) 10 MG tablet Take 10 mg by mouth at bedtime.     [provider]  Carboxymethylcellul-Glycerin (REFRESH OPTIVE) 1-0.9 % GEL Place 1 drop into both eyes 2 (two) times daily.    [provider]  cholecalciferol (VITAMIN D) 1000 units tablet Take 2,000 Units by mouth daily.    [provider]  docusate sodium (COLACE)  100 MG capsule Take 100 mg by mouth 2 (two) times daily.    [provider]  DULoxetine (CYMBALTA) 60 MG capsule Take 60 mg by mouth daily.    [provider]  ferrous sulfate 325 (65 FE) MG tablet Take 325 mg by mouth daily with breakfast.    [provider]  furosemide (LASIX) 20 MG tablet Take 20 mg by mouth daily.     [provider]  insulin glargine (LANTUS) 100 UNIT/ML injection Inject 0.1 mLs (10 Units total) into the skin every 12 (twelve) hours. 11/22/15   Leatha Gilding, MD  levofloxacin (LEVAQUIN) 500 MG tablet Take 1 tablet (500 mg total) by mouth daily. For 3 more days 11/22/15   Leatha Gilding, MD  lisinopril (PRINIVIL,ZESTRIL) 10 MG tablet Take 2 tablets (20 mg total) by mouth daily. 11/22/15   Leatha Gilding, MD  magnesium oxide (MAG-OX) 400 (241.3 Mg) MG tablet Take 400 mg by mouth daily.    [provider]  metFORMIN (GLUCOPHAGE) 1000 MG tablet Take 1,000 mg by mouth 2 (two) times daily with a meal.    [provider]  metoprolol succinate (TOPROL-XL) 50 MG 24 hr tablet Take 50-100 mg by mouth 2 (two) times daily. Pt takes two tablets in the morning and one tablet at bedtime.    [provider]  omeprazole (PRILOSEC) 20 MG capsule Take 20 mg by mouth daily before breakfast.     [provider]  polyethylene glycol (MIRALAX / GLYCOLAX) packet Take 17 g by mouth at bedtime.    [provider]  senna (SENOKOT) 8.6 MG tablet Take 2 tablets by mouth at bedtime.     [provider]    Family History Family History  Problem Relation Age of Onset  . Diabetes Mellitus II Mother     Social History Social History   Tobacco Use  . Smoking status: Never Smoker  . Smokeless tobacco: Never Used  Substance Use Topics  . Alcohol use: No    Alcohol/week: 0.0 standard drinks  . Drug use: No     Allergies   Ampicillin   Review of Systems Review of Systems  Unable to perform ROS:  Acuity of condition     Physical Exam Updated Vital Signs There were no vitals taken for this visit.  Physical Exam Vitals signs and nursing note reviewed.  Constitutional:      General: She is not in acute distress.    Appearance: Normal appearance. She is well-developed. She is not toxic-appearing.  HENT:     Head: Normocephalic and atraumatic.  Eyes:     General: Lids are normal.     Conjunctiva/sclera: Conjunctivae normal.     Pupils: Pupils are equal, round, and reactive to light.  Neck:     Musculoskeletal: Normal range of motion and neck supple.     Thyroid: No thyroid mass.     Trachea: No tracheal deviation.  Cardiovascular:     Rate and Rhythm: Normal rate and regular rhythm.     Heart sounds: Normal heart sounds. No murmur. No gallop.   Pulmonary:     Effort: Tachypnea and respiratory distress present.     Breath sounds: Decreased air movement present. No stridor. Examination of the right-lower field reveals decreased breath sounds. Examination of the left-lower field reveals decreased breath sounds. Decreased breath sounds present. No wheezing, rhonchi or rales.  Abdominal:     General: Bowel sounds are normal. There is no distension.     Palpations: Abdomen is soft.     Tenderness: There is no abdominal tenderness. There is no rebound.  Musculoskeletal: Normal range of motion.        General: No tenderness.  Skin:    General: Skin is warm and dry.     Findings: No abrasion or rash.  Neurological:     Mental Status: She is oriented to person, place, and time. She is lethargic.     GCS: GCS eye subscore is 4. GCS verbal subscore is 5. GCS motor subscore is 6.     Cranial Nerves: No cranial nerve deficit.     Sensory: No sensory deficit.     Motor: No tremor.     Comments: Withdraws to pain in all 4 extremities      ED Treatments / Results  Labs (all labs ordered are listed, but only abnormal results are displayed) Labs Reviewed  URINE CULTURE  CULTURE,  BLOOD (ROUTINE X 2)  CULTURE, BLOOD (ROUTINE X 2)  BLOOD GAS, ARTERIAL  BRAIN NATRIURETIC PEPTIDE  CBC WITH DIFFERENTIAL/PLATELET  COMPREHENSIVE METABOLIC PANEL  URINALYSIS, ROUTINE W REFLEX MICROSCOPIC  LACTIC ACID, PLASMA  LACTIC ACID, PLASMA    EKG EKG Interpretation  Date/Time:  Tuesday June 06 2018 19:40:57 EST Ventricular Rate:  81 PR Interval:    QRS Duration: 160  QT Interval:  392 QTC Calculation: 455 R Axis:   45 Text Interpretation:  Sinus rhythm Ventricular trigeminy Prolonged PR interval Right bundle branch block No significant change since last tracing Confirmed by Lorre NickAllen, Annamarie Yamaguchi ([id number]) on 06/06/2018 7:43:58 PM   Radiology No results found.  Procedures Procedures (including critical care time)  Medications Ordered in ED Medications  0.9 %  sodium chloride infusion (has no administration in time range)     Initial Impression / Assessment and Plan / ED Course  I have reviewed the triage vital signs and the nursing notes.  Pertinent labs & imaging results that were available during my care of the patient were reviewed by me and considered in my medical decision making (see chart for details).    Patient is likely retaining some CO2 when she first arrived here as she was on 100 percent nonrebreather.  Blood gas was noted with some hypercarbia.  Patient's mental status did improve.  Patient placed on nasal cannula.  Chest x-ray consistent with CHF as well as pneumonia.  Discussed with the nurse practitioner on-call at the nursing home where patient is living.  Patient is currently being treated with Cipro and Flagyl for a pulmonary infection.  Will restart those medications.  Also given small dose of Lasix 20 mg IV push.  Patient is a DNR and hospice has been involved in her care as well.  Due to soft blood pressures will bring patient in the hospital   CRITICAL CARE Performed by: Toy BakerAnthony T Lanique Gonzalo Total critical care time: 60 minutes Critical care time was  exclusive of separately billable procedures and treating other patients. Critical care was necessary to treat or prevent imminent or life-threatening deterioration. Critical care was time spent personally by me on the following activities: development of treatment plan with patient and/or surrogate as well as nursing, discussions with consultants, evaluation of patient's response to treatment, examination of patient, obtaining history from patient or surrogate, ordering and performing treatments and interventions, ordering and review of laboratory studies, ordering and review of radiographic studies, pulse oximetry and re-evaluation of patient's condition.  Final Clinical Impressions(s) / ED Diagnoses   Final diagnoses:  None    ED Discharge Orders    None       Lorre NickAllen, Germain Koopmann, MD 06/06/18 2335

## 2018-06-06 NOTE — ED Notes (Signed)
Pure Wick placed on pt to obtain urine specimen

## 2018-06-07 ENCOUNTER — Observation Stay (HOSPITAL_COMMUNITY): Payer: Medicare Other

## 2018-06-07 DIAGNOSIS — L89152 Pressure ulcer of sacral region, stage 2: Secondary | ICD-10-CM | POA: Diagnosis present

## 2018-06-07 DIAGNOSIS — F329 Major depressive disorder, single episode, unspecified: Secondary | ICD-10-CM | POA: Diagnosis present

## 2018-06-07 DIAGNOSIS — R4182 Altered mental status, unspecified: Secondary | ICD-10-CM | POA: Diagnosis not present

## 2018-06-07 DIAGNOSIS — I361 Nonrheumatic tricuspid (valve) insufficiency: Secondary | ICD-10-CM

## 2018-06-07 DIAGNOSIS — K219 Gastro-esophageal reflux disease without esophagitis: Secondary | ICD-10-CM | POA: Diagnosis present

## 2018-06-07 DIAGNOSIS — N179 Acute kidney failure, unspecified: Secondary | ICD-10-CM

## 2018-06-07 DIAGNOSIS — A419 Sepsis, unspecified organism: Secondary | ICD-10-CM | POA: Diagnosis present

## 2018-06-07 DIAGNOSIS — M542 Cervicalgia: Secondary | ICD-10-CM

## 2018-06-07 DIAGNOSIS — G629 Polyneuropathy, unspecified: Secondary | ICD-10-CM | POA: Diagnosis present

## 2018-06-07 DIAGNOSIS — J90 Pleural effusion, not elsewhere classified: Secondary | ICD-10-CM | POA: Diagnosis not present

## 2018-06-07 DIAGNOSIS — E119 Type 2 diabetes mellitus without complications: Secondary | ICD-10-CM | POA: Diagnosis not present

## 2018-06-07 DIAGNOSIS — J9601 Acute respiratory failure with hypoxia: Secondary | ICD-10-CM | POA: Diagnosis present

## 2018-06-07 DIAGNOSIS — Z66 Do not resuscitate: Secondary | ICD-10-CM | POA: Diagnosis present

## 2018-06-07 DIAGNOSIS — J440 Chronic obstructive pulmonary disease with acute lower respiratory infection: Secondary | ICD-10-CM | POA: Diagnosis present

## 2018-06-07 DIAGNOSIS — R0600 Dyspnea, unspecified: Secondary | ICD-10-CM | POA: Diagnosis present

## 2018-06-07 DIAGNOSIS — J9620 Acute and chronic respiratory failure, unspecified whether with hypoxia or hypercapnia: Secondary | ICD-10-CM | POA: Diagnosis not present

## 2018-06-07 DIAGNOSIS — G4733 Obstructive sleep apnea (adult) (pediatric): Secondary | ICD-10-CM | POA: Diagnosis present

## 2018-06-07 DIAGNOSIS — Z993 Dependence on wheelchair: Secondary | ICD-10-CM | POA: Diagnosis not present

## 2018-06-07 DIAGNOSIS — D649 Anemia, unspecified: Secondary | ICD-10-CM | POA: Diagnosis present

## 2018-06-07 DIAGNOSIS — E785 Hyperlipidemia, unspecified: Secondary | ICD-10-CM | POA: Diagnosis present

## 2018-06-07 DIAGNOSIS — Z79899 Other long term (current) drug therapy: Secondary | ICD-10-CM | POA: Diagnosis not present

## 2018-06-07 DIAGNOSIS — J9622 Acute and chronic respiratory failure with hypercapnia: Secondary | ICD-10-CM

## 2018-06-07 DIAGNOSIS — J189 Pneumonia, unspecified organism: Secondary | ICD-10-CM | POA: Diagnosis present

## 2018-06-07 DIAGNOSIS — I959 Hypotension, unspecified: Secondary | ICD-10-CM | POA: Diagnosis present

## 2018-06-07 DIAGNOSIS — E11649 Type 2 diabetes mellitus with hypoglycemia without coma: Secondary | ICD-10-CM | POA: Diagnosis present

## 2018-06-07 DIAGNOSIS — J9621 Acute and chronic respiratory failure with hypoxia: Secondary | ICD-10-CM | POA: Diagnosis not present

## 2018-06-07 DIAGNOSIS — I69354 Hemiplegia and hemiparesis following cerebral infarction affecting left non-dominant side: Secondary | ICD-10-CM | POA: Diagnosis not present

## 2018-06-07 DIAGNOSIS — F0390 Unspecified dementia without behavioral disturbance: Secondary | ICD-10-CM | POA: Diagnosis present

## 2018-06-07 DIAGNOSIS — Z515 Encounter for palliative care: Secondary | ICD-10-CM | POA: Diagnosis not present

## 2018-06-07 DIAGNOSIS — I5033 Acute on chronic diastolic (congestive) heart failure: Secondary | ICD-10-CM | POA: Diagnosis present

## 2018-06-07 DIAGNOSIS — Z7982 Long term (current) use of aspirin: Secondary | ICD-10-CM | POA: Diagnosis not present

## 2018-06-07 DIAGNOSIS — Y95 Nosocomial condition: Secondary | ICD-10-CM | POA: Diagnosis present

## 2018-06-07 DIAGNOSIS — J918 Pleural effusion in other conditions classified elsewhere: Secondary | ICD-10-CM | POA: Diagnosis present

## 2018-06-07 LAB — CBC WITH DIFFERENTIAL/PLATELET
Abs Immature Granulocytes: 0.08 10*3/uL — ABNORMAL HIGH (ref 0.00–0.07)
BASOS PCT: 0 %
Basophils Absolute: 0 10*3/uL (ref 0.0–0.1)
Eosinophils Absolute: 0 10*3/uL (ref 0.0–0.5)
Eosinophils Relative: 0 %
HCT: 37.8 % (ref 36.0–46.0)
Hemoglobin: 11.3 g/dL — ABNORMAL LOW (ref 12.0–15.0)
Immature Granulocytes: 1 %
Lymphocytes Relative: 18 %
Lymphs Abs: 1.4 10*3/uL (ref 0.7–4.0)
MCH: 27.6 pg (ref 26.0–34.0)
MCHC: 29.9 g/dL — AB (ref 30.0–36.0)
MCV: 92.2 fL (ref 80.0–100.0)
Monocytes Absolute: 1 10*3/uL (ref 0.1–1.0)
Monocytes Relative: 14 %
Neutro Abs: 5 10*3/uL (ref 1.7–7.7)
Neutrophils Relative %: 67 %
Platelets: 146 10*3/uL — ABNORMAL LOW (ref 150–400)
RBC: 4.1 MIL/uL (ref 3.87–5.11)
RDW: 15.9 % — ABNORMAL HIGH (ref 11.5–15.5)
WBC: 7.5 10*3/uL (ref 4.0–10.5)
nRBC: 0.4 % — ABNORMAL HIGH (ref 0.0–0.2)

## 2018-06-07 LAB — URINALYSIS, ROUTINE W REFLEX MICROSCOPIC
Bilirubin Urine: NEGATIVE
Glucose, UA: NEGATIVE mg/dL
Ketones, ur: NEGATIVE mg/dL
Nitrite: NEGATIVE
Protein, ur: NEGATIVE mg/dL
SPECIFIC GRAVITY, URINE: 1.017 (ref 1.005–1.030)
pH: 5 (ref 5.0–8.0)

## 2018-06-07 LAB — COMPREHENSIVE METABOLIC PANEL
ALT: 14 U/L (ref 0–44)
ANION GAP: 14 (ref 5–15)
AST: 16 U/L (ref 15–41)
Albumin: 3.3 g/dL — ABNORMAL LOW (ref 3.5–5.0)
Alkaline Phosphatase: 26 U/L — ABNORMAL LOW (ref 38–126)
BUN: 30 mg/dL — ABNORMAL HIGH (ref 8–23)
CO2: 35 mmol/L — ABNORMAL HIGH (ref 22–32)
Calcium: 8.7 mg/dL — ABNORMAL LOW (ref 8.9–10.3)
Chloride: 96 mmol/L — ABNORMAL LOW (ref 98–111)
Creatinine, Ser: 1.09 mg/dL — ABNORMAL HIGH (ref 0.44–1.00)
GFR calc Af Amer: 54 mL/min — ABNORMAL LOW (ref >60)
GFR calc non Af Amer: 46 mL/min — ABNORMAL LOW (ref >60)
Glucose, Bld: 126 mg/dL — ABNORMAL HIGH (ref 70–99)
Potassium: 4.5 mmol/L (ref 3.5–5.1)
Sodium: 145 mmol/L (ref 135–145)
Total Bilirubin: 0.8 mg/dL (ref 0.3–1.2)
Total Protein: 6.2 g/dL — ABNORMAL LOW (ref 6.5–8.1)

## 2018-06-07 LAB — GLUCOSE, CAPILLARY
GLUCOSE-CAPILLARY: 110 mg/dL — AB (ref 70–99)
GLUCOSE-CAPILLARY: 109 mg/dL — AB (ref 70–99)
Glucose-Capillary: 158 mg/dL — ABNORMAL HIGH (ref 70–99)
Glucose-Capillary: 118 mg/dL — ABNORMAL HIGH (ref 70–99)

## 2018-06-07 LAB — ECHOCARDIOGRAM COMPLETE
Height: 65 in
Weight: 3580.27 oz

## 2018-06-07 LAB — PROCALCITONIN: Procalcitonin: <0.1 ng/mL

## 2018-06-07 LAB — BRAIN NATRIURETIC PEPTIDE: B Natriuretic Peptide: 591.2 pg/mL — ABNORMAL HIGH (ref 0.0–100.0)

## 2018-06-07 LAB — MAGNESIUM: Magnesium: 2.6 mg/dL — ABNORMAL HIGH (ref 1.7–2.4)

## 2018-06-07 MED ORDER — DULOXETINE HCL 20 MG PO CPEP
20.0000 mg | ORAL_CAPSULE | Freq: Every day | ORAL | Status: DC
  Administered 2018-06-07: 20 mg via ORAL
  Filled 2018-06-07 (×3): qty 1

## 2018-06-07 MED ORDER — LEVOFLOXACIN IN D5W 750 MG/150ML IV SOLN
750.0000 mg | INTRAVENOUS | Status: DC
  Administered 2018-06-07: 750 mg via INTRAVENOUS
  Filled 2018-06-07: qty 150

## 2018-06-07 MED ORDER — INSULIN GLARGINE 100 UNIT/ML ~~LOC~~ SOLN
35.0000 [IU] | Freq: Every day | SUBCUTANEOUS | Status: DC
  Administered 2018-06-07 – 2018-06-10 (×4): 35 [IU] via SUBCUTANEOUS
  Filled 2018-06-07 (×4): qty 0.35

## 2018-06-07 MED ORDER — VITAMIN D 25 MCG (1000 UNIT) PO TABS
2000.0000 [IU] | ORAL_TABLET | Freq: Every day | ORAL | Status: DC
  Administered 2018-06-07 – 2018-06-13 (×7): 2000 [IU] via ORAL
  Filled 2018-06-07 (×7): qty 2

## 2018-06-07 MED ORDER — CALCIUM CARBONATE ANTACID 500 MG PO CHEW: 2.0000 | CHEWABLE_TABLET | Freq: Four times a day (QID) | ORAL | Status: DC | PRN

## 2018-06-07 MED ORDER — POLYVINYL ALCOHOL 1.4 % OP SOLN
1.0000 [drp] | Freq: Four times a day (QID) | OPHTHALMIC | Status: DC
  Administered 2018-06-07 – 2018-06-13 (×23): 1 [drp] via OPHTHALMIC
  Filled 2018-06-07: qty 15

## 2018-06-07 MED ORDER — ACETAMINOPHEN 325 MG PO TABS
650.0000 mg | ORAL_TABLET | Freq: Two times a day (BID) | ORAL | Status: DC
  Administered 2018-06-07 – 2018-06-13 (×13): 650 mg via ORAL
  Filled 2018-06-07 (×13): qty 2

## 2018-06-07 MED ORDER — PANTOPRAZOLE SODIUM 40 MG PO TBEC
40.0000 mg | DELAYED_RELEASE_TABLET | Freq: Every day | ORAL | Status: DC
  Administered 2018-06-07 – 2018-06-13 (×7): 40 mg via ORAL
  Filled 2018-06-07 (×7): qty 1

## 2018-06-07 MED ORDER — LEVOFLOXACIN IN D5W 750 MG/150ML IV SOLN
500.0000 mg | INTRAVENOUS | Status: DC
  Filled 2018-06-07: qty 150

## 2018-06-07 MED ORDER — MAGNESIUM GLUCONATE 500 MG PO TABS
500.0000 mg | ORAL_TABLET | Freq: Three times a day (TID) | ORAL | Status: DC
  Administered 2018-06-07 – 2018-06-13 (×19): 500 mg via ORAL
  Filled 2018-06-07 (×22): qty 1

## 2018-06-07 MED ORDER — GABAPENTIN 300 MG PO CAPS
300.0000 mg | ORAL_CAPSULE | Freq: Three times a day (TID) | ORAL | Status: DC
  Administered 2018-06-07 (×3): 300 mg via ORAL
  Filled 2018-06-07 (×3): qty 1

## 2018-06-07 MED ORDER — HEPARIN SODIUM (PORCINE) 5000 UNIT/ML IJ SOLN
5000.0000 [IU] | Freq: Three times a day (TID) | INTRAMUSCULAR | Status: DC
  Administered 2018-06-07 – 2018-06-13 (×19): 5000 [IU] via SUBCUTANEOUS
  Filled 2018-06-07 (×19): qty 1

## 2018-06-07 MED ORDER — ATORVASTATIN CALCIUM 10 MG PO TABS
10.0000 mg | ORAL_TABLET | Freq: Every day | ORAL | Status: DC
  Administered 2018-06-07 – 2018-06-12 (×6): 10 mg via ORAL
  Filled 2018-06-07 (×6): qty 1

## 2018-06-07 MED ORDER — METRONIDAZOLE IN NACL 5-0.79 MG/ML-% IV SOLN
500.0000 mg | Freq: Three times a day (TID) | INTRAVENOUS | Status: DC
  Administered 2018-06-07 – 2018-06-08 (×4): 500 mg via INTRAVENOUS
  Filled 2018-06-07 (×4): qty 100

## 2018-06-07 MED ORDER — FUROSEMIDE 10 MG/ML IJ SOLN
40.0000 mg | Freq: Once | INTRAMUSCULAR | Status: AC
  Administered 2018-06-07: 40 mg via INTRAVENOUS
  Filled 2018-06-07: qty 4

## 2018-06-07 MED ORDER — INSULIN GLARGINE 100 UNIT/ML ~~LOC~~ SOLN
32.0000 [IU] | Freq: Every day | SUBCUTANEOUS | Status: DC
  Administered 2018-06-07 – 2018-06-09 (×3): 32 [IU] via SUBCUTANEOUS
  Filled 2018-06-07 (×3): qty 0.32

## 2018-06-07 MED ORDER — SODIUM FLUORIDE 1.1 % DT PSTE: PASTE | Freq: Every evening | DENTAL | Status: DC

## 2018-06-07 MED ORDER — INSULIN ASPART 100 UNIT/ML ~~LOC~~ SOLN
0.0000 [IU] | Freq: Three times a day (TID) | SUBCUTANEOUS | Status: DC
  Administered 2018-06-07: 3 [IU] via SUBCUTANEOUS
  Administered 2018-06-08 (×3): 2 [IU] via SUBCUTANEOUS
  Administered 2018-06-10: 3 [IU] via SUBCUTANEOUS
  Administered 2018-06-11: 5 [IU] via SUBCUTANEOUS
  Administered 2018-06-11 – 2018-06-12 (×2): 3 [IU] via SUBCUTANEOUS
  Administered 2018-06-12 (×2): 2 [IU] via SUBCUTANEOUS
  Administered 2018-06-13: 3 [IU] via SUBCUTANEOUS
  Administered 2018-06-13: 2 [IU] via SUBCUTANEOUS

## 2018-06-07 MED ORDER — QUETIAPINE FUMARATE 25 MG PO TABS
25.0000 mg | ORAL_TABLET | Freq: Every day | ORAL | Status: DC
  Administered 2018-06-07: 25 mg via ORAL
  Filled 2018-06-07: qty 1

## 2018-06-07 MED ORDER — QUETIAPINE FUMARATE 25 MG PO TABS
50.0000 mg | ORAL_TABLET | Freq: Every day | ORAL | Status: DC
  Administered 2018-06-07 – 2018-06-08 (×2): 50 mg via ORAL
  Filled 2018-06-07 (×2): qty 2

## 2018-06-07 MED ORDER — IPRATROPIUM-ALBUTEROL 0.5-2.5 (3) MG/3ML IN SOLN: 3.0000 mL | Freq: Four times a day (QID) | RESPIRATORY_TRACT | Status: DC | PRN

## 2018-06-07 MED ORDER — FERROUS SULFATE 325 (65 FE) MG PO TABS
325.0000 mg | ORAL_TABLET | Freq: Every day | ORAL | Status: DC
  Administered 2018-06-07 – 2018-06-13 (×7): 325 mg via ORAL
  Filled 2018-06-07 (×8): qty 1

## 2018-06-07 MED ORDER — ASPIRIN EC 81 MG PO TBEC
81.0000 mg | DELAYED_RELEASE_TABLET | Freq: Every day | ORAL | Status: DC
  Administered 2018-06-07 – 2018-06-13 (×7): 81 mg via ORAL
  Filled 2018-06-07 (×7): qty 1

## 2018-06-07 MED ORDER — CARBOXYMETHYLCELLULOSE SODIUM 1 % OP GEL: 1.0000 [drp] | Freq: Four times a day (QID) | OPHTHALMIC | Status: DC

## 2018-06-07 MED ORDER — PREGABALIN 25 MG PO CAPS
50.0000 mg | ORAL_CAPSULE | Freq: Every day | ORAL | Status: DC
  Administered 2018-06-07: 50 mg via ORAL
  Filled 2018-06-07: qty 2

## 2018-06-07 NOTE — H&P (Signed)
History and Physical    Niala Cabbagestalk YEB:[id number] DOB: July 07, 1930 DOA: 06/06/2018  PCP: Renford Dills, MD Patient coming from: Nursing home  Chief Complaint: Shortness of breath  HPI: Sara Schmidt is a 85 y.o. female with medical history significant of depression, diabetes, diastolic congestive heart failure, obesity hypoventilation syndrome, OSA, prior CVA with residual left hemiparesis presenting from nursing home for evaluation of shortness of breath.  She is currently being treated for pulmonary infection with Levaquin and Flagyl at her nursing home.  Per EMS, patient sats were in the 80s and she was placed on nonrebreather.  Patient is oriented to self only.  Reports having dyspnea and cough.  Repeatedly complaining of neck pain.  No additional history could be obtained from the patient.  Review of Systems: As per HPI otherwise 10 point review of systems negative.  Past Medical History:  Diagnosis Date  . Depression   . Diabetes (HCC)   . Hemiplegia (HCC)     History reviewed. No pertinent surgical history.   reports that she has never smoked. She has never used smokeless tobacco. She reports that she does not drink alcohol or use drugs.  Allergies  Allergen Reactions  . Ampicillin Shortness Of Breath    Reaction:  Unknown  Has patient had a PCN reaction causing immediate rash, facial/tongue/throat swelling, SOB or lightheadedness with hypotension:  Unsure Has patient had a PCN reaction causing severe rash involving mucus membranes or skin necrosis: Unsure Has patient had a PCN reaction that required hospitalization Unsure Has patient had a PCN reaction occurring within the last 10 years: Unsure If all of the above answers are "NO", then may proceed with Cephalosporin use.    Family History  Problem Relation Age of Onset  . Diabetes Mellitus II Mother     Prior to Admission medications   Medication Sig Start Date End Date Taking? Authorizing Provider  acetaminophen  (TYLENOL) 650 MG CR tablet Take 650 mg by mouth every 12 (twelve) hours.    Yes [provider]  amLODipine (NORVASC) 10 MG tablet Take 0.5 tablets (5 mg total) by mouth daily. Patient taking differently: Take 10 mg by mouth daily.  11/22/15  Yes Leatha Gilding, MD  aspirin EC 81 MG tablet Take 81 mg by mouth daily.   Yes [provider]  atorvastatin (LIPITOR) 10 MG tablet Take 10 mg by mouth at bedtime.    Yes [provider]  calcium carbonate (TUMS - DOSED IN MG ELEMENTAL CALCIUM) 500 MG chewable tablet Chew 2 tablets by mouth 4 (four) times daily as needed for indigestion or heartburn.   Yes [provider]  Carboxymeth-Glycerin-Polysorb (REFRESH OPTIVE ADVANCED) 0.5-1-0.5 % SOLN Place 1 drop into both eyes 4 (four) times daily.   Yes [provider]  Carboxymethylcellulose Sodium (REFRESH LIQUIGEL) 1 % GEL Place 1 drop into both eyes 4 (four) times daily.   Yes [provider]  cholecalciferol (VITAMIN D) 1000 units tablet Take 2,000 Units by mouth daily.   Yes [provider]  cloNIDine (CATAPRES) 0.1 MG tablet Take 0.1 mg by mouth 2 (two) times daily.   Yes [provider]  DULoxetine (CYMBALTA) 20 MG capsule Take 20 mg by mouth daily.   Yes [provider]  ferrous sulfate 325 (65 FE) MG tablet Take 325 mg by mouth daily with breakfast.   Yes [provider]  furosemide (LASIX) 20 MG tablet Take 20 mg by mouth daily.    Yes [provider]  gabapentin (NEURONTIN) 300 MG capsule Take 300 mg by mouth 3 (three) times daily.   Yes [provider]  insulin glargine (LANTUS) 100 UNIT/ML injection Inject 0.1 mLs (10 Units total) into the skin every 12 (twelve) hours. Patient taking differently: Inject 32-35 Units into the skin See admin instructions. Use 35 units every morning and use 32 units at bedtime 11/22/15  Yes Gherghe, Daylene Katayamaostin M, MD  ipratropium-albuterol (DUONEB) 0.5-2.5 (3) MG/3ML  SOLN Take 3 mLs by nebulization every 6 (six) hours as needed (for wheezing).   Yes [provider]  levofloxacin (LEVAQUIN) 750 MG/150ML SOLN Inject 750 mg into the vein daily.   Yes [provider]  lisinopril (PRINIVIL,ZESTRIL) 10 MG tablet Take 2 tablets (20 mg total) by mouth daily. 11/22/15  Yes Gherghe, Daylene Katayamaostin M, MD  magnesium gluconate (MAGONATE) 500 MG tablet Take 500 mg by mouth 3 (three) times daily.   Yes [provider]  metFORMIN (GLUCOPHAGE) 1000 MG tablet Take 1,000 mg by mouth 2 (two) times daily with a meal.   Yes [provider]  metoprolol succinate (TOPROL-XL) 50 MG 24 hr tablet Take 50-100 mg by mouth See admin instructions. Take 2 tablets in the morning and take 1 tablet at bedtime.   Yes [provider]  metronidazole (FLAGYL) 500-0.74 MG/100ML-% Inject 500 mg into the vein every 8 (eight) hours.   Yes [provider]  nitroGLYCERIN (NITROSTAT) 0.4 MG SL tablet Place 0.4 mg under the tongue every 5 (five) minutes as needed for chest pain.   Yes [provider]  omeprazole (PRILOSEC) 20 MG capsule Take 20 mg by mouth daily before breakfast.    Yes [provider]  pregabalin (LYRICA) 50 MG capsule Take 50 mg by mouth daily.   Yes [provider]  QUEtiapine (SEROQUEL) 25 MG tablet Take 25-50 mg by mouth See admin instructions. Take 1 tablet every morning and take 2 tablets every night at bedtime   Yes [provider]  Sodium Fluoride (PREVIDENT 5000 BOOSTER) 1.1 % PSTE Place onto teeth every evening.   Yes [provider]  levofloxacin (LEVAQUIN) 500 MG tablet Take 1 tablet (500 mg total) by mouth daily. For 3 more days Patient not taking: Reported on 06/07/2018 11/22/15   Leatha GildingGherghe, Costin M, MD    Physical Exam: Vitals:   06/07/18 0030 06/07/18 0100 06/07/18 0203 06/07/18 0509  BP: 109/71 105/71 (!) 101/57 (!) 98/53  Pulse: 62 (!) 38 73 75  Resp: (!) 23 (!) 23 16 18   Temp:    98.1 F (36.7 C) 98.2 F (36.8 C)  TempSrc:   Oral Oral  SpO2: 93% 95% 92% 92%  Weight:    101.5 kg  Height:    5\' 5"  (1.651 m)    Physical Exam  Constitutional: She appears well-developed and well-nourished. No distress.  HENT:  Head: Normocephalic.  Mouth/Throat: Oropharynx is clear and moist.  Eyes: Pupils are equal, round, and reactive to light. Right eye exhibits no discharge. Left eye exhibits no discharge.  Neck: Neck supple.  Cardiovascular: Normal rate, regular rhythm and intact distal pulses.  Pulmonary/Chest: Effort normal. She has rales.  Anterior lung fields auscultated Rales appreciated On 4 L supplemental oxygen  Abdominal: Soft. Bowel sounds are normal. She exhibits no distension. There is no abdominal tenderness. There is no guarding.  Musculoskeletal:        General: No edema.  Neurological:  Awake and alert Following commands Oriented to self only Left hemiparesis (residual from prior  CVA)  Skin: Skin is warm and dry. She is not diaphoretic.     Labs on Admission: I have personally reviewed following labs and imaging studies  CBC: Recent Labs  Lab 06/06/18 1952 06/06/18 2020  WBC 6.0  --   NEUTROABS 3.5  --   HGB 10.2* 11.2*  HCT 35.8* 33.0*  MCV 93.0  --   PLT 176  --    Basic Metabolic Panel: Recent Labs  Lab 06/06/18 1952 06/06/18 2020  NA 143 143  K 4.2 4.2  CL 97*  --   CO2 36*  --   GLUCOSE 142*  --   BUN 29*  --   CREATININE 1.19*  --   CALCIUM 8.7*  --    GFR: Estimated Creatinine Clearance: 40.8 mL/min (A) (by C-G formula based on SCr of 1.19 mg/dL (H)). Liver Function Tests: Recent Labs  Lab 06/06/18 1952  AST 17  ALT 15  ALKPHOS 28*  BILITOT 0.9  PROT 5.9*  ALBUMIN 3.2*   No results for input(s): LIPASE, AMYLASE in the last 168 hours. No results for input(s): AMMONIA in the last 168 hours. Coagulation Profile: No results for input(s): INR, PROTIME in the last 168 hours. Cardiac Enzymes: No results for  input(s): CKTOTAL, CKMB, CKMBINDEX, TROPONINI in the last 168 hours. BNP (last 3 results) No results for input(s): PROBNP in the last 8760 hours. HbA1C: No results for input(s): HGBA1C in the last 72 hours. CBG: Recent Labs  Lab 06/07/18 0818  GLUCAP 110*   Lipid Profile: No results for input(s): CHOL, HDL, LDLCALC, TRIG, CHOLHDL, LDLDIRECT in the last 72 hours. Thyroid Function Tests: No results for input(s): TSH, T4TOTAL, FREET4, T3FREE, THYROIDAB in the last 72 hours. Anemia Panel: No results for input(s): VITAMINB12, FOLATE, FERRITIN, TIBC, IRON, RETICCTPCT in the last 72 hours. Urine analysis: No results found for: COLORURINE, APPEARANCEUR, LABSPEC, PHURINE, GLUCOSEU, HGBUR, BILIRUBINUR, KETONESUR, PROTEINUR, UROBILINOGEN, NITRITE, LEUKOCYTESUR  Radiological Exams on Admission: Dg Cervical Spine Complete  Result Date: 06/07/2018 CLINICAL DATA:  Neck pain EXAM: CERVICAL SPINE - COMPLETE 4+ VIEW COMPARISON:  MR angiogram of the neck dated 11/13/2003 FINDINGS: Suboptimal visualization of the lower cervical spine due to the patient's shoulders despite swimmer's view. Portions of cortex of C6, C7, and the upper margin of T1 remain somewhat obscured on the lateral projections. No prevertebral soft tissue swelling is identified. Multilevel spondylosis with anterior bridging spurring at C4-5 and C5-6 along with large anterior interbody spurs at C6-7 and C7-T1. No appreciable malalignment in the cervical spine Ill definition of the neural foramina on the oblique projections, foraminal impingement by uncinate and facet spurring is anticipated at multiple levels. No fracture is identified. Sclerosis in the left mandible, incompletely evaluated. IMPRESSION: 1. No cervical spine fracture or acute subluxation. Sensitivity for subtle abnormalities is reduced especially on the lateral projections due to obscuration of the lower cervical spine. 2. Multilevel spondylosis likely resulting in multilevel  foraminal impingement based on the oblique projections. 3. Incompletely evaluated sclerosis in the left mandible. 4. If comprehensive workup is warranted, dedicated CT or MRI would be recommended. Electronically Signed   By: Gaylyn Rong M.D.   On: 06/07/2018 08:18   Dg Chest Port 1 View  Result Date: 06/06/2018 CLINICAL DATA:  Dyspnea EXAM: PORTABLE CHEST 1 VIEW COMPARISON:  11/20/2015 FINDINGS: Confluent opacities in the mid to lower lungs are noted bilaterally with interstitial edema seen. Findings may reflect stigmata of pulmonary edema. Superimposed pneumonia would be difficult to entirely exclude as  would layering bilateral effusions. Cardiomegaly is stable. Aortic atherosclerosis at the arch is noted without aneurysmal dilatation. No acute osseous abnormality is seen. IMPRESSION: Cardiomegaly with interstitial pulmonary edema. Superimposed pneumonia would be difficult to entirely exclude. Probable bilateral layering effusions. Electronically Signed   By: Tollie Ethavid  Kwon M.D.   On: 06/06/2018 20:10    EKG: Independently reviewed.  Sinus rhythm, ventricular bigeminy.  No significant change since prior tracing.  Assessment/Plan Principal Problem:   Acute on chronic respiratory failure (HCC) Active Problems:   Diabetes mellitus type 2, controlled (HCC)   Neck pain   AMS (altered mental status)   AKI (acute kidney injury) (HCC)   Acute on chronic hypercarbic respiratory failure with respiratory acidosis secondary to CAP, acute exacerbation of chronic diastolic congestive heart failure, underlying obesity hypoventilation syndrome, and OSA -Was initially on nonrebreather in the ED. currently satting well on 4 L supplemental oxygen via nasal cannula. -BNP 591, above baseline.  Chest x-ray showing cardiomegaly with interstitial pulmonary edema and suspicion for superimposed pneumonia.  Also showing probable bilateral layering effusions. -Patient is afebrile and does not have leukocytosis.   Currently being treated with Levaquin and Flagyl at nursing home. Will continue these antibiotics at this time.  Check procalcitonin level. -Received IV Lasix 20 mg in the ED.  Blood pressure currently soft. Will hold off ordering additional doses at this time. -Echocardiogram -Monitor intake and output, daily weights -CPAP at night  ?Sepsis Lactic acid 2.1 > 1.5 without IV fluid.  Blood pressure intermittently soft but received IV Lasix.  Patient is afebrile and does not have leukocytosis. -Continue antibiotics for pneumonia.  Check procalcitonin level. -UA, urine culture pending -Blood culture x2 pending  Neck pain Patient repeatedly complaining of neck pain.  Possibly musculoskeletal/muscle spasm due to poor posture in bed. -X-ray of C-spine  ?AMS Patient is oriented to self only.  Unclear what her baseline mental status is.  She may have baseline dementia.  No family available to answer questions.  Continue treatment as mentioned above.  Consider discussing with nursing home in the morning.  If no improvement, obtain imaging of brain.  AKI Creatinine 1.1, baseline 0.7. -Continue to monitor renal function with IV diuresis  Chronic anemia Hemoglobin 10.2, no recent baseline.  Labs from 2 years ago showing hemoglobin ranging from 10.5-11.7.  No signs of active bleeding.  Continue to monitor.  Type 2 diabetes -Continue home dose of Lantus -Sliding scale insulin with CBG checks  Hyperlipidemia -Continue statin  Depression -Continue duloxetine, Seroquel  Neuropathy -Continue gabapentin, Lyrica  GERD -Continue PPI  DVT prophylaxis: Subcutaneous heparin Code Status: DNR Family Communication: No family available. Disposition Plan: Anticipate discharge after clinical improvement. Consults called: None Admission status: Observation, progressive care unit   John GiovanniVasundhra Gesenia Bantz MD [facility] Pager [phone]  If 7PM-7AM, please contact  night-coverage [email or web address] Password [email or web address]  06/07/2018, 8:35 AM

## 2018-06-07 NOTE — Progress Notes (Signed)
  Echocardiogram 2D Echocardiogram has been performed.  Pieter Partridge 06/07/2018, 2:35 PM

## 2018-06-07 NOTE — Progress Notes (Signed)
Patient ID: Sara Schmidt, female   DOB: 01/01/1931, 85 y.o.   MRN: [id number] Patient was admitted early this morning for shortness of breath, required nonrebreather in the ED.  She was started on IV antibiotics.  Patient seen and examined at bedside.  Still slightly confused.  I have reviewed patient's medical records including this morning's H&P, her labs myself.  Continue current treatment.  Repeat a.m. labs.

## 2018-06-07 NOTE — NC FL2 (Addendum)
[place] MEDICAID FL2 LEVEL OF CARE SCREENING TOOL     IDENTIFICATION  Patient Name: Sara Schmidt Birthdate: 07-22-1930 Sex: female Admission Date (Current Location): 06/06/2018  [facility] and [place] Number:  Producer, television/film/video and Address:  [facility] [place], [address], [place], [place] [address]      Provider Number: [id number]  Attending Physician Name and Address:  Glade Lloyd, MD  Relative Name and Phone Number:       Current Level of Care: Hospital Recommended Level of Care: Skilled Nursing Facility Prior Approval Number:    Date Approved/Denied:   PASRR Number:    Discharge Plan: SNF    Current Diagnoses: Patient Active Problem List   Diagnosis Date Noted  . Acute on chronic respiratory failure (HCC) 06/07/2018  . Neck pain 06/07/2018  . AMS (altered mental status) 06/07/2018  . AKI (acute kidney injury) (HCC) 06/07/2018  . Acute respiratory failure with hypoxia (HCC) 06/07/2018  . COPD exacerbation (HCC)   . Dyspnea   . Hypoxia   . HCAP (healthcare-associated pneumonia) 11/17/2015  . Essential hypertension 11/17/2015  . Diabetes mellitus type 2, controlled (HCC) 11/17/2015  . OSA on CPAP 11/17/2015  . Pneumonia 11/17/2015    Orientation RESPIRATION BLADDER Height & Weight     Self  O2(Nasal cannula 4L) Incontinent, External catheter Weight: 101.5 kg Height:  5\' 5"  (165.1 cm)  BEHAVIORAL SYMPTOMS/MOOD NEUROLOGICAL BOWEL NUTRITION STATUS      Continent Diet(Please see DC Summary)  AMBULATORY STATUS COMMUNICATION OF NEEDS Skin   Extensive Assist Verbally PU Stage and Appropriate Care(Stage II on coccyx)                       Personal Care Assistance Level of Assistance  Bathing, Feeding, Dressing Bathing Assistance: Maximum assistance Feeding assistance: Limited assistance Dressing Assistance: Limited assistance     Functional Limitations Info  Sight, Hearing, Speech Sight Info: Adequate Hearing Info:  Adequate Speech Info: Adequate    SPECIAL CARE FACTORS FREQUENCY   Speech    2x/week                Contractures Contractures Info: Not present    Additional Factors Info  Code Status, Allergies, Psychotropic, Insulin Sliding Scale Code Status Info: DNR Allergies Info: Ampicillin Psychotropic Info: Cymbalta, Seroquel, Lyrica Insulin Sliding Scale Info: 3x daily with meals       Current Medications (06/07/2018):  This is the current hospital active medication list Current Facility-Administered Medications  Medication Dose Route Frequency Provider Last Rate Last Dose  . 0.9 %  sodium chloride infusion   Intravenous Continuous John Giovanni, MD 20 mL/hr at 06/06/18 2348    . acetaminophen (TYLENOL) tablet 650 mg  650 mg Oral Q12H John Giovanni, MD   650 mg at 06/07/18 0950  . aspirin EC tablet 81 mg  81 mg Oral Daily John Giovanni, MD   81 mg at 06/07/18 0950  . atorvastatin (LIPITOR) tablet 10 mg  10 mg Oral QHS John Giovanni, MD      . calcium carbonate (TUMS - dosed in mg elemental calcium) chewable tablet 400 mg of elemental calcium  2 tablet Oral QID PRN John Giovanni, MD      . cholecalciferol (VITAMIN D3) tablet 2,000 Units  2,000 Units Oral Daily John Giovanni, MD   2,000 Units at 06/07/18 0950  . DULoxetine (CYMBALTA) DR capsule 20 mg  20 mg Oral Daily John Giovanni, MD   20 mg  at 06/07/18 1235  . ferrous sulfate tablet 325 mg  325 mg Oral Q breakfast John Giovanniathore, Vasundhra, MD   325 mg at 06/07/18 0837  . gabapentin (NEURONTIN) capsule 300 mg  300 mg Oral TID John Giovanniathore, Vasundhra, MD   300 mg at 06/07/18 0951  . heparin injection 5,000 Units  5,000 Units Subcutaneous Q8H John Giovanniathore, Vasundhra, MD   5,000 Units at 06/07/18 1337  . insulin aspart (novoLOG) injection 0-15 Units  0-15 Units Subcutaneous TID WC John Giovanniathore, Vasundhra, MD   3 Units at 06/07/18 1236  . insulin glargine (LANTUS) injection 32 Units  32 Units Subcutaneous QHS Alekh, Kshitiz,  MD      . insulin glargine (LANTUS) injection 35 Units  35 Units Subcutaneous Daily John Giovanniathore, Vasundhra, MD   35 Units at 06/07/18 0951  . ipratropium-albuterol (DUONEB) 0.5-2.5 (3) MG/3ML nebulizer solution 3 mL  3 mL Nebulization Q6H PRN John Giovanniathore, Vasundhra, MD      . levofloxacin (LEVAQUIN) IVPB 750 mg  750 mg Intravenous Q24H John Giovanniathore, Vasundhra, MD 100 mL/hr at 06/07/18 1236 750 mg at 06/07/18 1236  . magnesium gluconate (MAGONATE) tablet 500 mg  500 mg Oral TID John Giovanniathore, Vasundhra, MD   500 mg at 06/07/18 1235  . metronidazole (FLAGYL) IVPB 500 mg  500 mg Intravenous Q8H John Giovanniathore, Vasundhra, MD 100 mL/hr at 06/07/18 1343 500 mg at 06/07/18 1343  . pantoprazole (PROTONIX) EC tablet 40 mg  40 mg Oral Daily John Giovanniathore, Vasundhra, MD   40 mg at 06/07/18 0950  . polyvinyl alcohol (LIQUIFILM TEARS) 1.4 % ophthalmic solution 1 drop  1 drop Both Eyes QID John Giovanniathore, Vasundhra, MD   1 drop at 06/07/18 1344  . pregabalin (LYRICA) capsule 50 mg  50 mg Oral Daily John Giovanniathore, Vasundhra, MD   50 mg at 06/07/18 0950  . QUEtiapine (SEROQUEL) tablet 25 mg  25 mg Oral Daily John Giovanniathore, Vasundhra, MD   25 mg at 06/07/18 0950  . QUEtiapine (SEROQUEL) tablet 50 mg  50 mg Oral QHS John Giovanniathore, Vasundhra, MD         Discharge Medications: Please see discharge summary for a list of discharge medications.  Relevant Imaging Results:  Relevant Lab Results:   Additional Information SSN: [id number]  Mearl LatinNadia S Jamari Moten, LCSW

## 2018-06-07 NOTE — Progress Notes (Signed)
Patient confused RT and RN in agreement patient will not tolerate CPAP at this time.  RT will continue to monitor.

## 2018-06-07 NOTE — Evaluation (Signed)
Clinical/Bedside Swallow Evaluation Patient Details  Name: Sara Schmidt MRN: [id number] Date of Birth: 1930-12-07  Today's Date: 06/07/2018 Time: SLP Start Time (ACUTE ONLY): 1519 SLP Stop Time (ACUTE ONLY): 1549 SLP Time Calculation (min) (ACUTE ONLY): 30 min  Past Medical History:  Past Medical History:  Diagnosis Date  . Depression   . Diabetes (HCC)   . Hemiplegia [facility])    Past Surgical History: History reviewed. No pertinent surgical history. HPI:  Pt is an 85 yo female admitted from SNF with SOB. CXR showed cardiomegaly with interstitial pulmonary edema with superimposed PNA not excluded. Pt had MBS in July 2017 that showed a mild oral dysphagia but good airway protection. Dys 3 diet and thin liquids recommended at that time. PMH: CVA with residual L hemiplegia, DM, depression, CHF, OSA, obesity hypoventilation syndrome   Assessment / Plan / Recommendation Clinical Impression  Pt has a mild oral dysphagia marked by mildly prolonged oral preparation, left-sided spillage, and trace-mild oral residue. She appears to manage this well with extra time and Min cues for use of a liquid wash. No overt signs of aspiration are observed during PO intake. Pt does have a difficult time answering my questions about her hx and most recent diet. Would continue with current diet of regular textures and thin liquids. Given concern for PNA, SLP f/u for tolerance is warranted.  SLP Visit Diagnosis: Dysphagia, oral phase (R13.11)    Aspiration Risk  Mild aspiration risk    Diet Recommendation Regular;Thin liquid   Liquid Administration via: Cup;Straw Medication Administration: Whole meds with liquid Supervision: Staff to assist with self feeding Compensations: Slow rate;Small sips/bites Postural Changes: Seated upright at 90 degrees    Other  Recommendations Oral Care Recommendations: Oral care BID;Other (Comment)(after meals) Other Recommendations: Have oral suction available   Follow up  Recommendations 24 hour supervision/assistance      Frequency and Duration min 2x/week  1 week       Prognosis Prognosis for Safe Diet Advancement: Good      Swallow Study   General HPI: Pt is an 85 yo female admitted from SNF with SOB. CXR showed cardiomegaly with interstitial pulmonary edema with superimposed PNA not excluded. Pt had MBS in July 2017 that showed a mild oral dysphagia but good airway protection. Dys 3 diet and thin liquids recommended at that time. PMH: CVA with residual L hemiplegia, DM, depression, CHF, OSA, obesity hypoventilation syndrome Type of Study: Bedside Swallow Evaluation Previous Swallow Assessment: see HPI Diet Prior to this Study: Regular;Thin liquids Temperature Spikes Noted: No Respiratory Status: Nasal cannula History of Recent Intubation: No Behavior/Cognition: Alert;Cooperative;Pleasant mood;Distractible Oral Cavity Assessment: Other (comment)(trace amounts of residue/L sided spillage) Oral Care Completed by SLP: No Oral Cavity - Dentition: Dentures, top Vision: Functional for self-feeding Self-Feeding Abilities: Needs assist Patient Positioning: Upright in bed Baseline Vocal Quality: Wet;Other (comment)(initially but cleared with cued throat clear, productive) Volitional Cough: Strong    Oral/Motor/Sensory Function Overall Oral Motor/Sensory Function: Mild impairment(baseline L facial droop)   Ice Chips Ice chips: Not tested   Thin Liquid Thin Liquid: Within functional limits Presentation: Self Fed;Straw    Nectar Thick Nectar Thick Liquid: Not tested   Honey Thick Honey Thick Liquid: Not tested   Puree Puree: Within functional limits Presentation: Spoon   Solid     Solid: Impaired Presentation: Self Fed Oral Phase Functional Implications: Left anterior spillage;Prolonged oral transit      Skip Mayer 06/07/2018,4:04 PM  Sara Schmidt, M.A. CCC-SLP Acute Rehabilitation  Chief Financial Officer [phone] Office  [phone]

## 2018-06-07 NOTE — Progress Notes (Signed)
CSW attempted number on patient's facesheet-it was an incorrect number so CSW deleted it. CSW will reach out to [facility] to see if they have any family contacts. CSW contacted cousin, Sam, however, he did not have a Engineer, technical sales.   Osborne Casco Taber Sweetser LCSW [phone]

## 2018-06-08 ENCOUNTER — Inpatient Hospital Stay (HOSPITAL_COMMUNITY): Payer: Medicare Other

## 2018-06-08 DIAGNOSIS — N179 Acute kidney failure, unspecified: Secondary | ICD-10-CM

## 2018-06-08 DIAGNOSIS — R4182 Altered mental status, unspecified: Secondary | ICD-10-CM

## 2018-06-08 DIAGNOSIS — I5033 Acute on chronic diastolic (congestive) heart failure: Secondary | ICD-10-CM

## 2018-06-08 DIAGNOSIS — R0600 Dyspnea, unspecified: Secondary | ICD-10-CM

## 2018-06-08 DIAGNOSIS — L8990 Pressure ulcer of unspecified site, unspecified stage: Secondary | ICD-10-CM

## 2018-06-08 DIAGNOSIS — J90 Pleural effusion, not elsewhere classified: Secondary | ICD-10-CM

## 2018-06-08 DIAGNOSIS — Z515 Encounter for palliative care: Secondary | ICD-10-CM

## 2018-06-08 DIAGNOSIS — J189 Pneumonia, unspecified organism: Secondary | ICD-10-CM

## 2018-06-08 LAB — BASIC METABOLIC PANEL
ANION GAP: 6 (ref 5–15)
BUN: 30 mg/dL — ABNORMAL HIGH (ref 8–23)
CO2: 37 mmol/L — ABNORMAL HIGH (ref 22–32)
Calcium: 8.1 mg/dL — ABNORMAL LOW (ref 8.9–10.3)
Chloride: 100 mmol/L (ref 98–111)
Creatinine, Ser: 1.48 mg/dL — ABNORMAL HIGH (ref 0.44–1.00)
GFR calc Af Amer: 37 mL/min — ABNORMAL LOW (ref >60)
GFR calc non Af Amer: 32 mL/min — ABNORMAL LOW (ref >60)
Glucose, Bld: 152 mg/dL — ABNORMAL HIGH (ref 70–99)
Potassium: 4.3 mmol/L (ref 3.5–5.1)
Sodium: 143 mmol/L (ref 135–145)

## 2018-06-08 LAB — URINE CULTURE

## 2018-06-08 LAB — CBC WITH DIFFERENTIAL/PLATELET
Abs Immature Granulocytes: 0.02 10*3/uL (ref 0.00–0.07)
Basophils Absolute: 0 10*3/uL (ref 0.0–0.1)
Basophils Relative: 0 %
EOS PCT: 1 %
Eosinophils Absolute: 0.1 10*3/uL (ref 0.0–0.5)
HCT: 37.2 % (ref 36.0–46.0)
Hemoglobin: 10.3 g/dL — ABNORMAL LOW (ref 12.0–15.0)
IMMATURE GRANULOCYTES: 0 %
Lymphocytes Relative: 21 %
Lymphs Abs: 1.1 10*3/uL (ref 0.7–4.0)
MCH: 26.4 pg (ref 26.0–34.0)
MCHC: 27.7 g/dL — ABNORMAL LOW (ref 30.0–36.0)
MCV: 95.4 fL (ref 80.0–100.0)
Monocytes Absolute: 0.7 10*3/uL (ref 0.1–1.0)
Monocytes Relative: 13 %
Neutro Abs: 3.3 10*3/uL (ref 1.7–7.7)
Neutrophils Relative %: 65 %
Platelets: 160 10*3/uL (ref 150–400)
RBC: 3.9 MIL/uL (ref 3.87–5.11)
RDW: 15.8 % — ABNORMAL HIGH (ref 11.5–15.5)
WBC: 5.1 10*3/uL (ref 4.0–10.5)
nRBC: 0.4 % — ABNORMAL HIGH (ref 0.0–0.2)

## 2018-06-08 LAB — GLUCOSE, CAPILLARY
GLUCOSE-CAPILLARY: 131 mg/dL — AB (ref 70–99)
Glucose-Capillary: 133 mg/dL — ABNORMAL HIGH (ref 70–99)
Glucose-Capillary: 124 mg/dL — ABNORMAL HIGH (ref 70–99)
Glucose-Capillary: 103 mg/dL — ABNORMAL HIGH (ref 70–99)

## 2018-06-08 LAB — MAGNESIUM: Magnesium: 2.6 mg/dL — ABNORMAL HIGH (ref 1.7–2.4)

## 2018-06-08 MED ORDER — SODIUM CHLORIDE 0.9 % IV SOLN
2.0000 g | Freq: Two times a day (BID) | INTRAVENOUS | Status: DC
  Administered 2018-06-08 – 2018-06-09 (×3): 2 g via INTRAVENOUS
  Filled 2018-06-08 (×4): qty 2

## 2018-06-08 MED ORDER — SODIUM CHLORIDE 0.9 % IV BOLUS
250.0000 mL | Freq: Once | INTRAVENOUS | Status: AC
  Administered 2018-06-08: 250 mL via INTRAVENOUS
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
## 2018-06-08 NOTE — Progress Notes (Signed)
Pt BP 79/45, HR 80, RR 20, SpO2 96 % on 4 L [place], temp 97.8.Craige Cotta, Np notified.

## 2018-06-08 NOTE — Progress Notes (Signed)
Patient ID: Sara Schmidt Connery, female   DOB: Oct 14, 1930, 85 y.o.   MRN: [id number]  PROGRESS NOTE    Sara Schmidt Arno  UXL:[id number] DOB: Oct 14, 1930 DOA: 06/06/2018 PCP: Renford DillsPolite, Ronald, MD   Brief Narrative:  85 year old female with history of depression, diabetes, diastolic congestive heart failure, obesity hypoventilation syndrome, OSA, prior CVA with residual left hemiparesis presented from nursing home for evaluation of shortness of breath.  She was on Levaquin and Flagyl in the nursing home for treatment of pulmonary infection.  She was admitted with acute on chronic hypercarbic respiratory failure and initially was on nonrebreather.  Antibiotics were continued.  Also received intravenous Lasix.  Assessment & Plan:   Principal Problem:   Acute on chronic respiratory failure (HCC) Active Problems:   Diabetes mellitus type 2, controlled (HCC)   Neck pain   AMS (altered mental status)   AKI (acute kidney injury) (HCC)   Acute respiratory failure with hypoxia (HCC)   Acute on chronic hypercarbic respiratory failure with respiratory acidosis secondary to pneumonia/acute on chronic diastolic congestive heart failure  -Patient was on nonrebreather on presentation.  Currently on 4 L nasal cannula.  Wean off as able.  Probable healthcare associated pneumonia with bilateral pleural effusion -Patient was on Levaquin and Flagyl at the nursing home which has been continued.  Discontinue Flagyl and Levaquin.  Will start cefepime.  Follow cultures -Respiratory status improving but still requiring supplemental oxygen.  Cannot give diuretics because of low blood pressure.  Patient might benefit from thoracentesis.  Will request IR for the same.  Sepsis -Probably secondary to above.  Antibiotic plan as above.  IV fluid plan as below.  Blood cultures negative so far  Hypotension -Systolic blood pressure in the high 70s, low 80s this morning.  Will give normal saline 250 cc bolus.  Patient already received 250  cc bolus earlier this morning. -Hold sedative medications this morning. -Overall prognosis is guarded to poor.  Will get palliative care consult for goals of care.  Acute on chronic diastolic congestive heart failure -Strict input and output.  Daily weight.  Echo showed EF of 60 to 65%. -Patient received Lasix IV yesterday.  Currently hypotensive and cannot give more Lasix.  Chest x-ray from today shows bilateral pleural effusions.  Altered mental status -Unclear what her baseline mental status is.  She probably has baseline dementia.  This morning she is sleepy.  Will hold sedatives.  Monitor mental status.  Will get a CT scan of the brain.  Acute kidney injury -monitor renal function  Diabetes mellitus type 2 -Continue Lantus and CBGs with sliding scale insulin  Hyperlipidemia -continue statin  Depression -We will hold this a.m. dose of duloxetine and Seroquel  Neuropathy -Hold gabapentin and Lyrica because of mental status  GERD -continue PPI   DVT prophylaxis: Subcutaneous heparin Code Status: DNR Family Communication: None at bedside Disposition Plan: Discharge back to SNF in 1 to 3 days if clinically improved  Consultants: None  Procedures:  Echo on 06/07/2018 IMPRESSIONS    1. The left ventricle has normal systolic function with an ejection fraction of 60-65%. The cavity size was normal. There is moderately increased left ventricular wall thickness. Left ventricular diastolic Doppler parameters are consistent with  pseudonormalization secondary to atrial fibrillation.  2. The right ventricle has normal systolic function. The cavity was mildly enlarged. There is no increase in right ventricular wall thickness. Right ventricular systolic pressure is moderately elevated with an estimated pressure of 35.2 mmHg.  3. The mitral valve  is normal in structure. There is mild thickening and mild calcification. There is mild to moderate mitral annular calcification present.   4. The tricuspid valve is normal in structure.  5. The aortic valve is normal in structure. There is mild thickening and mild calcification of the aortic valve. Aortic valve regurgitation is trivial by color flow Doppler.  6. The LCC is heavily calcified and is immobile.  7. Right atrial pressure is estimated at 8 mmHg.  8. The interatrial septum was not assessed.   Antimicrobials:  Anti-infectives (From admission, onward)   Start     Dose/Rate Route Frequency Ordered Stop   06/08/18 1200  levofloxacin (LEVAQUIN) IVPB 500 mg     500 mg 66.7 mL/hr over 90 Minutes Intravenous Every 24 hours 06/07/18 1847 06/10/18 1159   06/07/18 1200  levofloxacin (LEVAQUIN) IVPB 750 mg  Status:  Discontinued     750 mg 100 mL/hr over 90 Minutes Intravenous Every 24 hours 06/07/18 0613 06/07/18 1847   06/07/18 0630  metronidazole (FLAGYL) IVPB 500 mg     500 mg 100 mL/hr over 60 Minutes Intravenous Every 8 hours 06/07/18 1610          Subjective: Patient seen and examined at bedside.  She is sleepy, hardly wakes up on calling her name.  Systolic blood pressure is currently in the high 70s, low 80s.  No overnight fever or vomiting reported.  Objective: Vitals:   06/08/18 0500 06/08/18 0634 06/08/18 0643 06/08/18 0820  BP:  (!) 74/39 (!) 78/45 (!) 77/51  Pulse:  65 80 60  Resp:  (!) 25 20   Temp:  97.8 F (36.6 C)  97.6 F (36.4 C)  TempSrc:  Oral  Axillary  SpO2:  96%  95%  Weight: 102.1 kg     Height:        Intake/Output Summary (Last 24 hours) at 06/08/2018 1121 Last data filed at 06/08/2018 0500 Gross per 24 hour  Intake 1307.08 ml  Output 150 ml  Net 1157.08 ml   Filed Weights   06/07/18 0509 06/08/18 0500  Weight: 101.5 kg 102.1 kg    Examination:  General exam: Elderly female, lying in bed.  No distress.  Sleepy, hardly wakes up Respiratory system: Bilateral decreased breath sounds at bases, basilar crackles Cardiovascular system: S1 & S2 heard, Rate  controlled Gastrointestinal system: Abdomen is nondistended, soft and nontender. Normal bowel sounds heard. Extremities: No cyanosis, clubbing; lower extremity edema  Central nervous system: Sleepy, hardly wakes up.  Skin: No rashes, lesions or ulcers Psychiatry: Could not be assessed because of mental status    Data Reviewed: I have personally reviewed following labs and imaging studies  CBC: Recent Labs  Lab 06/06/18 1952 06/06/18 2020 06/07/18 0916 06/08/18 0357  WBC 6.0  --  7.5 5.1  NEUTROABS 3.5  --  5.0 3.3  HGB 10.2* 11.2* 11.3* 10.3*  HCT 35.8* 33.0* 37.8 37.2  MCV 93.0  --  92.2 95.4  PLT 176  --  146* 160   Basic Metabolic Panel: Recent Labs  Lab 06/06/18 1952 06/06/18 2020 06/07/18 0916 06/08/18 0357  NA 143 143 145 143  K 4.2 4.2 4.5 4.3  CL 97*  --  96* 100  CO2 36*  --  35* 37*  GLUCOSE 142*  --  126* 152*  BUN 29*  --  30* 30*  CREATININE 1.19*  --  1.09* 1.48*  CALCIUM 8.7*  --  8.7* 8.1*  MG  --   --  2.6* 2.6*   GFR: Estimated Creatinine Clearance: 32.9 mL/min (A) (by C-G formula based on SCr of 1.48 mg/dL (H)). Liver Function Tests: Recent Labs  Lab 06/06/18 1952 06/07/18 0916  AST 17 16  ALT 15 14  ALKPHOS 28* 26*  BILITOT 0.9 0.8  PROT 5.9* 6.2*  ALBUMIN 3.2* 3.3*   No results for input(s): LIPASE, AMYLASE in the last 168 hours. No results for input(s): AMMONIA in the last 168 hours. Coagulation Profile: No results for input(s): INR, PROTIME in the last 168 hours. Cardiac Enzymes: No results for input(s): CKTOTAL, CKMB, CKMBINDEX, TROPONINI in the last 168 hours. BNP (last 3 results) No results for input(s): PROBNP in the last 8760 hours. HbA1C: No results for input(s): HGBA1C in the last 72 hours. CBG: Recent Labs  Lab 06/07/18 0818 06/07/18 1202 06/07/18 1651 06/07/18 2132 06/08/18 0837  GLUCAP 110* 158* 118* 109* 133*   Lipid Profile: No results for input(s): CHOL, HDL, LDLCALC, TRIG, CHOLHDL, LDLDIRECT in the last  72 hours. Thyroid Function Tests: No results for input(s): TSH, T4TOTAL, FREET4, T3FREE, THYROIDAB in the last 72 hours. Anemia Panel: No results for input(s): VITAMINB12, FOLATE, FERRITIN, TIBC, IRON, RETICCTPCT in the last 72 hours. Sepsis Labs: Recent Labs  Lab 06/06/18 1952 06/06/18 2255 06/07/18 0644  PROCALCITON  --   --  <0.10  LATICACIDVEN 2.1* 1.5  --     Recent Results (from the past 240 hour(s))  Culture, blood (Routine X 2) w Reflex to ID Panel     Status: None (Preliminary result)   Collection Time: 06/06/18  8:15 PM  Result Value Ref Range Status   Specimen Description BLOOD LEFT HAND  Final   Special Requests   Final    BOTTLES DRAWN AEROBIC AND ANAEROBIC Blood Culture adequate volume   Culture   Final    NO GROWTH 2 DAYS Performed at [facility], [address] [place], [place] [address]    Report Status PENDING  Incomplete  Culture, blood (Routine X 2) w Reflex to ID Panel     Status: None (Preliminary result)   Collection Time: 06/06/18  8:20 PM  Result Value Ref Range Status   Specimen Description BLOOD SITE NOT SPECIFIED  Final   Special Requests   Final    BOTTLES DRAWN AEROBIC AND ANAEROBIC Blood Culture adequate volume   Culture   Final    NO GROWTH 2 DAYS Performed at [facility], [address], [place], [place] [address]    Report Status PENDING  Incomplete  Urine Culture     Status: Abnormal   Collection Time: 06/07/18  3:27 PM  Result Value Ref Range Status   Specimen Description URINE, CLEAN CATCH  Final   Special Requests   Final    NONE Performed at [facility], [address] [place], [place] [address]    Culture MULTIPLE SPECIES PRESENT, SUGGEST RECOLLECTION (A)  Final   Report Status 06/08/2018 FINAL  Final         Radiology Studies: Dg Cervical Spine Complete  Result Date: 06/07/2018 CLINICAL DATA:  Neck pain EXAM: CERVICAL SPINE - COMPLETE 4+ VIEW COMPARISON:  MR angiogram of the neck dated 11/13/2003  FINDINGS: Suboptimal visualization of the lower cervical spine due to the patient's shoulders despite swimmer's view. Portions of cortex of C6, C7, and the upper margin of T1 remain somewhat obscured on the lateral projections. No prevertebral soft tissue swelling is identified. Multilevel spondylosis with anterior bridging  spurring at C4-5 and C5-6 along with large anterior interbody spurs at C6-7 and C7-T1. No appreciable malalignment in the cervical spine Ill definition of the neural foramina on the oblique projections, foraminal impingement by uncinate and facet spurring is anticipated at multiple levels. No fracture is identified. Sclerosis in the left mandible, incompletely evaluated. IMPRESSION: 1. No cervical spine fracture or acute subluxation. Sensitivity for subtle abnormalities is reduced especially on the lateral projections due to obscuration of the lower cervical spine. 2. Multilevel spondylosis likely resulting in multilevel foraminal impingement based on the oblique projections. 3. Incompletely evaluated sclerosis in the left mandible. 4. If comprehensive workup is warranted, dedicated CT or MRI would be recommended. Electronically Signed   By: Gaylyn Rong M.D.   On: 06/07/2018 08:18   Dg Chest Port 1 View  Result Date: 06/08/2018 CLINICAL DATA:  Shortness of breath EXAM: PORTABLE CHEST 1 VIEW COMPARISON:  Chest radiograph 06/06/2018 FINDINGS: Patient is rotated. Cardiomegaly. Aortic atherosclerosis. Moderate layering bilateral pleural effusions with underlying consolidation. IMPRESSION: Cardiomegaly. Layering bilateral effusions with underlying consolidation. Electronically Signed   By: Annia Belt M.D.   On: 06/08/2018 10:41   Dg Chest Port 1 View  Result Date: 06/06/2018 CLINICAL DATA:  Dyspnea EXAM: PORTABLE CHEST 1 VIEW COMPARISON:  11/20/2015 FINDINGS: Confluent opacities in the mid to lower lungs are noted bilaterally with interstitial edema seen. Findings may reflect stigmata  of pulmonary edema. Superimposed pneumonia would be difficult to entirely exclude as would layering bilateral effusions. Cardiomegaly is stable. Aortic atherosclerosis at the arch is noted without aneurysmal dilatation. No acute osseous abnormality is seen. IMPRESSION: Cardiomegaly with interstitial pulmonary edema. Superimposed pneumonia would be difficult to entirely exclude. Probable bilateral layering effusions. Electronically Signed   By: Tollie Eth M.D.   On: 06/06/2018 20:10        Scheduled Meds: . acetaminophen  650 mg Oral Q12H  . aspirin EC  81 mg Oral Daily  . atorvastatin  10 mg Oral QHS  . cholecalciferol  2,000 Units Oral Daily  . ferrous sulfate  325 mg Oral Q breakfast  . heparin  5,000 Units Subcutaneous Q8H  . insulin aspart  0-15 Units Subcutaneous TID WC  . insulin glargine  32 Units Subcutaneous QHS  . insulin glargine  35 Units Subcutaneous Daily  . magnesium gluconate  500 mg Oral TID  . pantoprazole  40 mg Oral Daily  . polyvinyl alcohol  1 drop Both Eyes QID  . QUEtiapine  50 mg Oral QHS   Continuous Infusions: . sodium chloride 20 mL/hr at 06/06/18 2348  . levofloxacin    . metroNIDAZOLE 500 mg (06/08/18 0626)     LOS: 1 day        Glade Lloyd, MD [facility] Pager [phone]  If 7PM-7AM, please contact night-coverage [email or web address] Password [facility] 06/08/2018, 11:21 AM

## 2018-06-08 NOTE — Progress Notes (Signed)
Pharmacy Antibiotic Note  Sara Schmidt is a 85 y.o. female admitted on 06/06/2018 with pneumonia.  Pharmacy has been consulted for cefepime dosing. SCr 1.48. CrCl~ 33 mL/min  Plan: Cefepime 2g IV every 24 hours Monitor renal function, Cx and ability to narrow  Height: 5\' 5"  (165.1 cm) Weight: 225 lb 1.4 oz (102.1 kg) IBW/kg (Calculated) : 57  Temp (24hrs), Avg:97.6 F (36.4 C), Min:97.3 F (36.3 C), Max:97.8 F (36.6 C)  Recent Labs  Lab 06/06/18 1952 06/06/18 2255 06/07/18 0916 06/08/18 0357  WBC 6.0  --  7.5 5.1  CREATININE 1.19*  --  1.09* 1.48*  LATICACIDVEN 2.1* 1.5  --   --     Estimated Creatinine Clearance: 32.9 mL/min (A) (by C-G formula based on SCr of 1.48 mg/dL (H)).    Allergies  Allergen Reactions  . Ampicillin Shortness Of Breath    Reaction:  Unknown  Has patient had a PCN reaction causing immediate rash, facial/tongue/throat swelling, SOB or lightheadedness with hypotension:  Unsure Has patient had a PCN reaction causing severe rash involving mucus membranes or skin necrosis: Unsure Has patient had a PCN reaction that required hospitalization Unsure Has patient had a PCN reaction occurring within the last 10 years: Unsure If all of the above answers are "NO", then may proceed with Cephalosporin use.    Antimicrobials this admission: levaquin 2/12>2/13 Flagyl 2/12>2/13 Cefepime 2/13>>  Dose adjustments this admission: n/a  Microbiology results: 2/11 BCx: ngtd 2/12 UCx: recollect  Daylene Posey, PharmD Clinical Pharmacist Please check AMION for all [facility] numbers 06/08/2018 11:59 AM

## 2018-06-08 NOTE — Progress Notes (Signed)
Patient BP 77/51 after 250cc NS bolus. MD Hanley Ben at bedside and aware. New order for additional 250cc NS bolus ordered. Will continue to monitor.

## 2018-06-08 NOTE — Progress Notes (Signed)
On afternoon vitals round patient's Oxygen saturation 87% on 4L O2 via [place].  O2 increased to 6L, BP 85/56. B;adder scan also showed >400 in bladder. MD Hanley Ben made aware, new orders received.   RT made aware and at bedside to place patient on HFNC. Will continue to monitor.

## 2018-06-08 NOTE — Evaluation (Signed)
Physical Therapy Evaluation Patient Details Name: Sara Schmidt MRN: [id number] DOB: 12-26-1930 Today's Date: 06/08/2018   History of Present Illness  Patient is an 85 year old admitted from facility with SOB. Patient has PMH to include CVA with left hemiparesis, depression, DM, CHF  Clinical Impression  Patient awake, pleasant, oriented only to self. Unable to provide history. Patient received in bed slumped over to her left side. Left side flaccid. Right side also weak, LE weaker than UE. Decreased ROM on right LE noted in ankle and knee. Patient requires total assist to perform bed mobility at this time. She tells me that she gets up to a chair at baseline, but is unable to tell me how this is accomplished. I am assuming hoyer lift. Patient will benefit from skilled PT to address her weakness and difficulty with bed mobility to achieve maximal independence level.         Follow Up Recommendations SNF    Equipment Recommendations  None recommended by PT    Recommendations for Other Services       Precautions / Restrictions Precautions Precautions: Fall Restrictions Weight Bearing Restrictions: No      Mobility  Bed Mobility Overal bed mobility: Needs Assistance Bed Mobility: Rolling;Supine to Sit Rolling: Total assist   Supine to sit: Total assist     General bed mobility comments: requires total assist  Transfers                 General transfer comment: unable/unsafe  Ambulation/Gait             General Gait Details: unable- patient non ambulatory at baseline  Stairs            Wheelchair Mobility    Modified Rankin (Stroke Patients Only)       Balance Overall balance assessment: Needs assistance Sitting-balance support: Bilateral upper extremity supported Sitting balance-Leahy Scale: Poor                                       Pertinent Vitals/Pain Pain Assessment: No/denies pain    Home Living Family/patient expects  to be discharged to:: Skilled nursing facility                      Prior Function           Comments: appears that patient requires total assist for mobility     Hand Dominance        Extremity/Trunk Assessment   Upper Extremity Assessment Upper Extremity Assessment: LUE deficits/detail;RUE deficits/detail LUE Deficits / Details: flaccid on left, weak on right    Lower Extremity Assessment Lower Extremity Assessment: LLE deficits/detail LLE Deficits / Details: flaccid L LE, weak right LE       Communication   Communication: No difficulties  Cognition Arousal/Alertness: Awake/alert Behavior During Therapy: WFL for tasks assessed/performed Overall Cognitive Status: No family/caregiver present to determine baseline cognitive functioning                                 General Comments: oriented to self only      General Comments      Exercises     Assessment/Plan    PT Assessment Patient needs continued PT services  PT Problem List Decreased strength;Decreased activity tolerance;Decreased mobility;Decreased cognition       PT  Treatment Interventions Functional mobility training;Balance training;Patient/family education;Therapeutic activities;Neuromuscular re-education;Therapeutic exercise    PT Goals (Current goals can be found in the Care Plan section)  Acute Rehab PT Goals Patient Stated Goal: no goals stated PT Goal Formulation: Patient unable to participate in goal setting Time For Goal Achievement: 06/22/18 Potential to Achieve Goals: Fair    Frequency Min 2X/week   Barriers to discharge        Co-evaluation               AM-PAC PT "6 Clicks" Mobility  Outcome Measure Help needed turning from your back to your side while in a flat bed without using bedrails?: Total Help needed moving from lying on your back to sitting on the side of a flat bed without using bedrails?: Total Help needed moving to and from a bed to  a chair (including a wheelchair)?: Total Help needed standing up from a chair using your arms (e.g., wheelchair or bedside chair)?: Total Help needed to walk in hospital room?: Total Help needed climbing 3-5 steps with a railing? : Total 6 Click Score: 6    End of Session Equipment Utilized During Treatment: Oxygen Activity Tolerance: Patient tolerated treatment well Patient left: in bed;with bed alarm set;with call bell/phone within reach Nurse Communication: Mobility status PT Visit Diagnosis: Muscle weakness (generalized) (M62.81)    Time: [id number] PT Time Calculation (min) (ACUTE ONLY): 13 min   Charges:   PT Evaluation $PT Eval Moderate Complexity: 1 Mod          Kierstin January, PT, [facility] 06/08/18,12:42 PM

## 2018-06-08 NOTE — Progress Notes (Signed)
  Speech Language Pathology Treatment: Dysphagia  Patient Details Name: Sara Schmidt MRN: [id number] DOB: 11/15/1930 Today's Date: 06/08/2018 Time: 1340-1400 SLP Time Calculation (min) (ACUTE ONLY): 20 min  Assessment / Plan / Recommendation Clinical Impression  Skilled treatment session focused on dysphagia goals. SLP facilitated session by providing skilled observation of pt consuming regular lunch tray with thin liquids. Pt required Min A cues to effectively consume d/t cognitive inattention and wanting to talk with SLP. Pt stated that she "enjoyed talking." When SLP stepped aside and was passively present in room, pt consumed more effectively. Regular diet continues to be appropriate diet and no further services are indicated at this time. ST to sign off.    HPI HPI: Pt is an 85 yo female admitted from SNF with SOB. CXR showed cardiomegaly with interstitial pulmonary edema with superimposed PNA not excluded. Pt had MBS in July 2017 that showed a mild oral dysphagia but good airway protection. Dys 3 diet and thin liquids recommended at that time. PMH: CVA with residual L hemiplegia, DM, depression, CHF, OSA, obesity hypoventilation syndrome      SLP Plan  Discharge SLP treatment due to (comment)(goals met)       Recommendations  Diet recommendations: Regular;Thin liquid Liquids provided via: Cup;Straw Medication Administration: Whole meds with liquid Supervision: Staff to assist with self feeding Compensations: Slow rate;Small sips/bites Postural Changes and/or Swallow Maneuvers: Seated upright 90 degrees                Oral Care Recommendations: Oral care BID;Other (Comment)(after meals) Follow up Recommendations: Skilled Nursing facility SLP Visit Diagnosis: Dysphagia, oral phase (R13.11) Plan: Discharge SLP treatment due to (comment)(goals met)       GO                Sara Schmidt 06/08/2018, 2:18 PM

## 2018-06-08 NOTE — Progress Notes (Signed)
RN called RT to bedside due to patient sating in the 80's. RT placed patient on HFNC salter 8L. Patient tolerating well, currently sating 93%. RT will continue to monitor as needed.

## 2018-06-08 NOTE — Evaluation (Signed)
Occupational Therapy Evaluation Patient Details Name: Sara Schmidt MRN: [id number] DOB: 1930/12/27 Today's Date: 06/08/2018    History of Present Illness Patient is an 85 year old admitted from facility with SOB. Patient has PMH to include CVA with left hemiparesis, depression, DM, CHF   Clinical Impression   Pt is a LTV resident of a SNF. Pt is at baseline total A level of care with ADLs/selfcare. Pt is able to groom and feed self with set up/sup sitting up in bed and required a mechanical lift for mobility at SNF at baseline. No further acute OT is indicated at this time and any further OT intervention to be deferred to her SNF    Follow Up Recommendations  SNF    Equipment Recommendations  None recommended by OT    Recommendations for Other Services       Precautions / Restrictions Precautions Precautions: Fall Restrictions Weight Bearing Restrictions: No      Mobility Bed Mobility Overal bed mobility: Needs Assistance Bed Mobility: Rolling;Supine to Sit;Sit to Supine Rolling: Total assist   Supine to sit: Total assist     General bed mobility comments: requires total assist  Transfers                 General transfer comment: NT, unable/unsafe, mechanical lift required at baseline    Balance Overall balance assessment: Needs assistance Sitting-balance support: Bilateral upper extremity supported Sitting balance-Leahy Scale: Poor                                     ADL either performed or assessed with clinical judgement   ADL Overall ADL's : Needs assistance/impaired;At baseline Eating/Feeding: Set up;Sitting;Bed level   Grooming: Wash/dry hands;Wash/dry face;Brushing hair;Sitting;Bed level;Supervision/safety;Set up   Upper Body Bathing: Total assistance   Lower Body Bathing: Total assistance   Upper Body Dressing : Total assistance   Lower Body Dressing: Total assistance       Toileting- Clothing Manipulation and Hygiene:  Total assistance;Bed level         General ADL Comments: requires lift at SNF for mobility from previous chart     Vision Patient Visual Report: No change from baseline       Perception     Praxis      Pertinent Vitals/Pain Pain Assessment: No/denies pain Faces Pain Scale: No hurt Pain Intervention(s): Monitored during session     Hand Dominance Right   Extremity/Trunk Assessment Upper Extremity Assessment Upper Extremity Assessment: Generalized weakness;LUE deficits/detail LUE Deficits / Details: flaccid on left, weak on right. hx of hemiplegia from CVA, edematous   Lower Extremity Assessment Lower Extremity Assessment: Defer to PT evaluation LLE Deficits / Details: flaccid L LE, weak right LE       Communication Communication Communication: No difficulties   Cognition Arousal/Alertness: Awake/alert Behavior During Therapy: WFL for tasks assessed/performed Overall Cognitive Status: No family/caregiver present to determine baseline cognitive functioning                                 General Comments: A & O x 1 (self only), but is able to report that she lives at a nursing home and how much ADL assist she receives there   General Comments       Exercises     Shoulder Instructions      Home Living Family/patient expects  to be discharged to:: Skilled nursing facility                                 Additional Comments: LTC resident of a SNF      Prior Functioning/Environment Level of Independence: Needs assistance  Gait / Transfers Assistance Needed: non ambulatory, requires lift for mobility at facility ADL's / Homemaking Assistance Needed: Pt reports that sh eis total care with ADLs, but is able to groom and feed herself   Comments: appears that patient requires total assist for mobility        OT Problem List: Decreased strength;Decreased activity tolerance;Decreased cognition;Decreased knowledge of use of DME or  AE;Impaired UE functional use;Increased edema;Decreased range of motion;Impaired balance (sitting and/or standing);Decreased coordination      OT Treatment/Interventions:      OT Goals(Current goals can be found in the care plan section) Acute Rehab OT Goals Patient Stated Goal: none stated OT Goal Formulation: With patient  OT Frequency:     Barriers to D/C:    no barriers, LTC resident of a SNF       Co-evaluation              AM-PAC OT "6 Clicks" Daily Activity     Outcome Measure Help from another person eating meals?: A Little Help from another person taking care of personal grooming?: A Little Help from another person toileting, which includes using toliet, bedpan, or urinal?: Total Help from another person bathing (including washing, rinsing, drying)?: Total Help from another person to put on and taking off regular upper body clothing?: Total Help from another person to put on and taking off regular lower body clothing?: Total 6 Click Score: 10   End of Session    Activity Tolerance: Patient limited by fatigue Patient left: in bed;with call bell/phone within reach;with bed alarm set  OT Visit Diagnosis: Muscle weakness (generalized) (M62.81);Other symptoms and signs involving cognitive function;Hemiplegia and hemiparesis Hemiplegia - Right/Left: Left Hemiplegia - dominant/non-dominant: Non-Dominant Hemiplegia - caused by: Unspecified                Time: 1259-1320 OT Time Calculation (min): 21 min Charges:  OT General Charges $OT Visit: 1 Visit OT Evaluation $OT Eval Moderate Complexity: 1 Mod    Galen Manila 06/08/2018, 2:03 PM

## 2018-06-09 ENCOUNTER — Inpatient Hospital Stay (HOSPITAL_COMMUNITY): Payer: Medicare Other

## 2018-06-09 ENCOUNTER — Other Ambulatory Visit: Payer: Self-pay

## 2018-06-09 DIAGNOSIS — J90 Pleural effusion, not elsewhere classified: Secondary | ICD-10-CM

## 2018-06-09 DIAGNOSIS — E119 Type 2 diabetes mellitus without complications: Secondary | ICD-10-CM

## 2018-06-09 DIAGNOSIS — J9620 Acute and chronic respiratory failure, unspecified whether with hypoxia or hypercapnia: Secondary | ICD-10-CM

## 2018-06-09 DIAGNOSIS — Z794 Long term (current) use of insulin: Secondary | ICD-10-CM

## 2018-06-09 LAB — MRSA PCR SCREENING: MRSA by PCR: NEGATIVE

## 2018-06-09 LAB — CBC WITH DIFFERENTIAL/PLATELET
Abs Immature Granulocytes: 0.02 10*3/uL (ref 0.00–0.07)
Basophils Absolute: 0 10*3/uL (ref 0.0–0.1)
Basophils Relative: 1 %
EOS PCT: 3 %
Eosinophils Absolute: 0.2 10*3/uL (ref 0.0–0.5)
HEMATOCRIT: 35.5 % — AB (ref 36.0–46.0)
Hemoglobin: 10.3 g/dL — ABNORMAL LOW (ref 12.0–15.0)
Immature Granulocytes: 0 %
LYMPHS ABS: 1.4 10*3/uL (ref 0.7–4.0)
Lymphocytes Relative: 23 %
MCH: 26.6 pg (ref 26.0–34.0)
MCHC: 29 g/dL — ABNORMAL LOW (ref 30.0–36.0)
MCV: 91.7 fL (ref 80.0–100.0)
Monocytes Absolute: 0.9 10*3/uL (ref 0.1–1.0)
Monocytes Relative: 14 %
Neutro Abs: 3.7 10*3/uL (ref 1.7–7.7)
Neutrophils Relative %: 59 %
Platelets: 166 10*3/uL (ref 150–400)
RBC: 3.87 MIL/uL (ref 3.87–5.11)
RDW: 15.8 % — ABNORMAL HIGH (ref 11.5–15.5)
WBC: 6.3 10*3/uL (ref 4.0–10.5)
nRBC: 0.3 % — ABNORMAL HIGH (ref 0.0–0.2)

## 2018-06-09 LAB — GLUCOSE, CAPILLARY
Glucose-Capillary: 81 mg/dL (ref 70–99)
Glucose-Capillary: 115 mg/dL — ABNORMAL HIGH (ref 70–99)
Glucose-Capillary: 86 mg/dL (ref 70–99)
Glucose-Capillary: 90 mg/dL (ref 70–99)

## 2018-06-09 LAB — BASIC METABOLIC PANEL
Anion gap: 8 (ref 5–15)
BUN: 33 mg/dL — ABNORMAL HIGH (ref 8–23)
CO2: 34 mmol/L — ABNORMAL HIGH (ref 22–32)
Calcium: 8 mg/dL — ABNORMAL LOW (ref 8.9–10.3)
Chloride: 102 mmol/L (ref 98–111)
Creatinine, Ser: 1.55 mg/dL — ABNORMAL HIGH (ref 0.44–1.00)
GFR calc Af Amer: 35 mL/min — ABNORMAL LOW (ref >60)
GFR calc non Af Amer: 30 mL/min — ABNORMAL LOW (ref >60)
Glucose, Bld: 85 mg/dL (ref 70–99)
Potassium: 4 mmol/L (ref 3.5–5.1)
Sodium: 144 mmol/L (ref 135–145)

## 2018-06-09 LAB — MAGNESIUM: Magnesium: 2.7 mg/dL — ABNORMAL HIGH (ref 1.7–2.4)

## 2018-06-09 MED ORDER — SODIUM CHLORIDE 0.9 % IV SOLN
2.0000 g | INTRAVENOUS | Status: DC
  Administered 2018-06-10 – 2018-06-13 (×4): 2 g via INTRAVENOUS
  Filled 2018-06-09 (×4): qty 2

## 2018-06-09 NOTE — Plan of Care (Signed)

## 2018-06-09 NOTE — Progress Notes (Signed)
Patient confused will not tolerate CPAP.  RT will continue to monitor.

## 2018-06-09 NOTE — Progress Notes (Signed)
Pharmacy Antibiotic Note  Sara Schmidt is a 85 y.o. female admitted on 06/06/2018 with pneumonia.  Pharmacy has been consulted for cefepime dosing.  We will adjust her cefepime dose today.   SCr 1.19. CrCl~ 32 mL/min  Plan: Cefepime 2g IV every 24 hours Monitor renal function, Cx and ability to narrow  Height: 5\' 5"  (165.1 cm) Weight: 231 lb 7.7 oz (105 kg) IBW/kg (Calculated) : 57  Temp (24hrs), Avg:98 F (36.7 C), Min:97.3 F (36.3 C), Max:98.2 F (36.8 C)  Recent Labs  Lab 06/06/18 1952 06/06/18 2255 06/07/18 0916 06/08/18 0357 06/09/18 0351  WBC 6.0  --  7.5 5.1 6.3  CREATININE 1.19*  --  1.09* 1.48* 1.55*  LATICACIDVEN 2.1* 1.5  --   --   --     Estimated Creatinine Clearance: 31.9 mL/min (A) (by C-G formula based on SCr of 1.55 mg/dL (H)).    Allergies  Allergen Reactions  . Ampicillin Shortness Of Breath    Reaction:  Unknown  Has patient had a PCN reaction causing immediate rash, facial/tongue/throat swelling, SOB or lightheadedness with hypotension:  Unsure Has patient had a PCN reaction causing severe rash involving mucus membranes or skin necrosis: Unsure Has patient had a PCN reaction that required hospitalization Unsure Has patient had a PCN reaction occurring within the last 10 years: Unsure If all of the above answers are "NO", then may proceed with Cephalosporin use.    Antimicrobials this admission: levaquin 2/12>2/13 Flagyl 2/12>2/13 Cefepime 2/13>>  Dose adjustments this admission: n/a  Microbiology results: 2/11 BCx: ngtd 2/12 UCx: recollect 2/14 MRSA>>neg  Sara Schmidt, PharmD, BCIDP, AAHIVP, CPP Infectious Disease Pharmacist 06/09/2018 2:41 PM

## 2018-06-09 NOTE — Progress Notes (Signed)
Patient ID: Sara Schmidt, female   DOB: 06-08-1930, 85 y.o.   MRN: [id number]  PROGRESS NOTE    Jericha Bryden  FAO:[id number] DOB: [birth year] DOA: 06/06/2018 PCP: Renford Dills, MD   Brief Narrative:  85 year old female with history of depression, diabetes, diastolic congestive heart failure, obesity hypoventilation syndrome, OSA, prior CVA with residual left hemiparesis presented from nursing home for evaluation of shortness of breath.  She was on Levaquin and Flagyl in the nursing home for treatment of pulmonary infection.  She was admitted with acute on chronic hypercarbic respiratory failure and initially was on nonrebreather.  Antibiotics were continued.  Also received intravenous Lasix.  Assessment & Plan:   Principal Problem:   Acute on chronic respiratory failure (HCC) Active Problems:   Diabetes mellitus type 2, controlled (HCC)   Neck pain   AMS (altered mental status)   AKI (acute kidney injury) (HCC)   Acute respiratory failure with hypoxia (HCC)   Pressure injury of skin   Pleural effusion   Palliative care encounter   Acute on chronic hypercarbic respiratory failure with respiratory acidosis secondary to pneumonia/acute on chronic diastolic congestive heart failure  -Patient was on nonrebreather on presentation.  Currently on 8 L nasal cannula.  -Respiratory status has worsened over the last 24 hours.  Probable healthcare associated pneumonia with bilateral pleural effusion -Patient was on Levaquin and Flagyl at the nursing home which was continued on admission but then subsequently switched to cefepime on 06/08/2018.  Cultures have been negative so far. -Ultrasound-guided thoracentesis is pending.  Respiratory status worsening at this time.  Sepsis -Probably secondary to above.  Antibiotic plan as above.  Blood cultures negative so far  Hypotension -Patient had received normal saline boluses on 06/08/2018.  Blood pressure on the lower side but stable. -Hold sedative  medications altogether including nighttime Seroquel. -Overall prognosis is guarded to poor.  Palliative care team is trying to meet with family today. -If condition does not improve, consider hospice/comfort measures  Acute on chronic diastolic congestive heart failure -Strict input and output.  Daily weight.  Echo showed EF of 60 to 65%. -Patient received IV Lasix on admission but subsequently has not been able to receive any more Lasix because of hypotension -Ultrasound-guided thoracentesis pending.  Altered mental status -Unclear what her baseline mental status is.  She probably has baseline dementia.   -Sleepy again this morning.  CT of the brain was negative for any new intracranial abnormality; has chronic old infarct with encephalomalacia -Apparently intermittently wakes up during the daytime and is more conversant. -We will hold off on MRI.  Will wait for further discussions of palliative care team with family  -Fall precautions.  Monitor mental status  acute kidney injury -monitor renal function.  Creatinine slightly worse today  Diabetes mellitus type 2 -Continue Lantus and CBGs with sliding scale insulin  Hyperlipidemia -continue statin  Depression - duloxetine and Seroquel will be kept on hold  Neuropathy -Hold gabapentin and Lyrica because of mental status  GERD -continue PPI   DVT prophylaxis: Subcutaneous heparin Code Status: DNR Family Communication: None at bedside Disposition Plan: Discharge back to SNF in 1 to 3 days if clinically improved  Consultants: None  Procedures:  Echo on 06/07/2018 IMPRESSIONS    1. The left ventricle has normal systolic function with an ejection fraction of 60-65%. The cavity size was normal. There is moderately increased left ventricular wall thickness. Left ventricular diastolic Doppler parameters are consistent with  pseudonormalization secondary to atrial fibrillation.  2.  The right ventricle has normal systolic  function. The cavity was mildly enlarged. There is no increase in right ventricular wall thickness. Right ventricular systolic pressure is moderately elevated with an estimated pressure of 35.2 mmHg.  3. The mitral valve is normal in structure. There is mild thickening and mild calcification. There is mild to moderate mitral annular calcification present.  4. The tricuspid valve is normal in structure.  5. The aortic valve is normal in structure. There is mild thickening and mild calcification of the aortic valve. Aortic valve regurgitation is trivial by color flow Doppler.  6. The LCC is heavily calcified and is immobile.  7. Right atrial pressure is estimated at 8 mmHg.  8. The interatrial septum was not assessed.   Antimicrobials:  Anti-infectives (From admission, onward)   Start     Dose/Rate Route Frequency Ordered Stop   06/08/18 1200  levofloxacin (LEVAQUIN) IVPB 500 mg  Status:  Discontinued     500 mg 66.7 mL/hr over 90 Minutes Intravenous Every 24 hours 06/07/18 1847 06/08/18 1145   06/08/18 1200  ceFEPIme (MAXIPIME) 2 g in sodium chloride 0.9 % 100 mL IVPB     2 g 200 mL/hr over 30 Minutes Intravenous Every 12 hours 06/08/18 1158     06/07/18 1200  levofloxacin (LEVAQUIN) IVPB 750 mg  Status:  Discontinued     750 mg 100 mL/hr over 90 Minutes Intravenous Every 24 hours 06/07/18 0613 06/07/18 1847   06/07/18 0630  metronidazole (FLAGYL) IVPB 500 mg  Status:  Discontinued     500 mg 100 mL/hr over 60 Minutes Intravenous Every 8 hours 06/07/18 6415 06/08/18 1145         Subjective: Patient seen and examined at bedside.  She is sleepy, hardly wakes up on calling her name.  She had received 2 boluses of normal saline yesterday as per nursing staff.  She looks more short of breath this morning.  No overnight fever or vomiting. Objective: Vitals:   06/08/18 2111 06/09/18 0416 06/09/18 0500 06/09/18 0914  BP: (!) 97/58 (!) 118/57    Pulse: 86 (!) 105    Resp: 18 15    Temp:  98 F (36.7 C) 98.1 F (36.7 C)    TempSrc: Oral Oral    SpO2: (!) 88% 96%  91%  Weight:   105 kg   Height:        Intake/Output Summary (Last 24 hours) at 06/09/2018 0919 Last data filed at 06/09/2018 0700 Gross per 24 hour  Intake 815.01 ml  Output 550 ml  Net 265.01 ml   Filed Weights   06/07/18 0509 06/08/18 0500 06/09/18 0500  Weight: 101.5 kg 102.1 kg 105 kg    Examination:  General exam: Elderly female, lying in bed.  No acute distress.  Sleepy, hardly wakes up Respiratory system: Bilateral decreased breath sounds at bases, basilar crackles.  No wheezing Cardiovascular system: Rate controlled, S1-S2 heard Gastrointestinal system: Abdomen is nondistended, soft and nontender. Normal bowel sounds heard. Extremities: No cyanosis; lower extremity edema  Central nervous system: Sleepy, hardly wakes up.  Skin: No rashes, lesions or ulcers Psychiatry: Could not be assessed because of mental status    Data Reviewed: I have personally reviewed following labs and imaging studies  CBC: Recent Labs  Lab 06/06/18 1952 06/06/18 2020 06/07/18 0916 06/08/18 0357 06/09/18 0351  WBC 6.0  --  7.5 5.1 6.3  NEUTROABS 3.5  --  5.0 3.3 3.7  HGB 10.2* 11.2* 11.3* 10.3* 10.3*  HCT 35.8* 33.0* 37.8 37.2 35.5*  MCV 93.0  --  92.2 95.4 91.7  PLT 176  --  146* 160 166   Basic Metabolic Panel: Recent Labs  Lab 06/06/18 1952 06/06/18 2020 06/07/18 0916 06/08/18 0357 06/09/18 0351  NA 143 143 145 143 144  K 4.2 4.2 4.5 4.3 4.0  CL 97*  --  96* 100 102  CO2 36*  --  35* 37* 34*  GLUCOSE 142*  --  126* 152* 85  BUN 29*  --  30* 30* 33*  CREATININE 1.19*  --  1.09* 1.48* 1.55*  CALCIUM 8.7*  --  8.7* 8.1* 8.0*  MG  --   --  2.6* 2.6* 2.7*   GFR: Estimated Creatinine Clearance: 31.9 mL/min (A) (by C-G formula based on SCr of 1.55 mg/dL (H)). Liver Function Tests: Recent Labs  Lab 06/06/18 1952 06/07/18 0916  AST 17 16  ALT 15 14  ALKPHOS 28* 26*  BILITOT 0.9 0.8  PROT  5.9* 6.2*  ALBUMIN 3.2* 3.3*   No results for input(s): LIPASE, AMYLASE in the last 168 hours. No results for input(s): AMMONIA in the last 168 hours. Coagulation Profile: No results for input(s): INR, PROTIME in the last 168 hours. Cardiac Enzymes: No results for input(s): CKTOTAL, CKMB, CKMBINDEX, TROPONINI in the last 168 hours. BNP (last 3 results) No results for input(s): PROBNP in the last 8760 hours. HbA1C: No results for input(s): HGBA1C in the last 72 hours. CBG: Recent Labs  Lab 06/08/18 0837 06/08/18 1227 06/08/18 1657 06/08/18 2305 06/09/18 0837  GLUCAP 133* 131* 124* 103* 81   Lipid Profile: No results for input(s): CHOL, HDL, LDLCALC, TRIG, CHOLHDL, LDLDIRECT in the last 72 hours. Thyroid Function Tests: No results for input(s): TSH, T4TOTAL, FREET4, T3FREE, THYROIDAB in the last 72 hours. Anemia Panel: No results for input(s): VITAMINB12, FOLATE, FERRITIN, TIBC, IRON, RETICCTPCT in the last 72 hours. Sepsis Labs: Recent Labs  Lab 06/06/18 1952 06/06/18 2255 06/07/18 0644  PROCALCITON  --   --  <0.10  LATICACIDVEN 2.1* 1.5  --     Recent Results (from the past 240 hour(s))  Culture, blood (Routine X 2) w Reflex to ID Panel     Status: None (Preliminary result)   Collection Time: 06/06/18  8:15 PM  Result Value Ref Range Status   Specimen Description BLOOD LEFT HAND  Final   Special Requests   Final    BOTTLES DRAWN AEROBIC AND ANAEROBIC Blood Culture adequate volume   Culture   Final    NO GROWTH 3 DAYS Performed at [facility], [address] [place], [place] [address]    Report Status PENDING  Incomplete  Culture, blood (Routine X 2) w Reflex to ID Panel     Status: None (Preliminary result)   Collection Time: 06/06/18  8:20 PM  Result Value Ref Range Status   Specimen Description BLOOD SITE NOT SPECIFIED  Final   Special Requests   Final    BOTTLES DRAWN AEROBIC AND ANAEROBIC Blood Culture adequate volume   Culture   Final    NO  GROWTH 3 DAYS Performed at [facility], [address] [place], [place] [address]    Report Status PENDING  Incomplete  Urine Culture     Status: Abnormal   Collection Time: 06/07/18  3:27 PM  Result Value Ref Range Status   Specimen Description URINE, CLEAN CATCH  Final   Special Requests   Final  NONE Performed at [facility], [address] [place], [place] [address]    Culture MULTIPLE SPECIES PRESENT, SUGGEST RECOLLECTION (A)  Final   Report Status 06/08/2018 FINAL  Final  MRSA PCR Screening     Status: None   Collection Time: 06/09/18  1:52 AM  Result Value Ref Range Status   MRSA by PCR NEGATIVE NEGATIVE Final    Comment:        The GeneXpert MRSA Assay (FDA approved for NASAL specimens only), is one component of a comprehensive MRSA colonization surveillance program. It is not intended to diagnose MRSA infection nor to guide or monitor treatment for MRSA infections. Performed at [facility], [address], [place], [place] [address]          Radiology Studies: Ct Head Wo Contrast  Result Date: 06/08/2018 CLINICAL DATA:  Altered mental status. EXAM: CT HEAD WITHOUT CONTRAST TECHNIQUE: Contiguous axial images were obtained from the base of the skull through the vertex without intravenous contrast. COMPARISON:  Brain MRI 11/13/2003 FINDINGS: Brain: No evidence of acute hemorrhage, hydrocephalus, or mass lesion/mass effect. An area of encephalomalacia within the vascular territory of the right MCA appears larger than on the comparison MRI dated 11/13/2003. This may represent matured ischemic changes, however reinfarction in the periphery of this lesion is difficult to exclude. Deep white matter microangiopathy. Vascular: Calcific atherosclerotic disease of the intra cavernous carotid arteries and bilateral MCA. Skull: Normal. Negative for fracture or focal lesion. Sinuses/Orbits: No acute finding. Other: None. IMPRESSION: An area of  encephalomalacia within the vascular territory of the right MCA appears larger than on the comparison MRI dated 11/13/2003. This may represent matured ischemic changes, however reinfarction in the periphery of this lesion is difficult to exclude. Deep white matter microangiopathy. No evidence of intracranial hemorrhage. Electronically Signed   By: Ted Mcalpine M.D.   On: 06/08/2018 14:43   Dg Chest Port 1 View  Result Date: 06/08/2018 CLINICAL DATA:  Shortness of breath EXAM: PORTABLE CHEST 1 VIEW COMPARISON:  Chest radiograph 06/06/2018 FINDINGS: Patient is rotated. Cardiomegaly. Aortic atherosclerosis. Moderate layering bilateral pleural effusions with underlying consolidation. IMPRESSION: Cardiomegaly. Layering bilateral effusions with underlying consolidation. Electronically Signed   By: Annia Belt M.D.   On: 06/08/2018 10:41        Scheduled Meds: . acetaminophen  650 mg Oral Q12H  . aspirin EC  81 mg Oral Daily  . atorvastatin  10 mg Oral QHS  . cholecalciferol  2,000 Units Oral Daily  . ferrous sulfate  325 mg Oral Q breakfast  . heparin  5,000 Units Subcutaneous Q8H  . insulin aspart  0-15 Units Subcutaneous TID WC  . insulin glargine  32 Units Subcutaneous QHS  . insulin glargine  35 Units Subcutaneous Daily  . magnesium gluconate  500 mg Oral TID  . pantoprazole  40 mg Oral Daily  . polyvinyl alcohol  1 drop Both Eyes QID  . QUEtiapine  50 mg Oral QHS   Continuous Infusions: . sodium chloride 10 mL/hr at 06/09/18 0700  . ceFEPime (MAXIPIME) IV 2 g (06/09/18 0916)     LOS: 2 days        Glade Lloyd, MD [facility] Pager [phone]  If 7PM-7AM, please contact night-coverage [email or web address] Password [facility] 06/09/2018, 9:19 AM

## 2018-06-09 NOTE — Progress Notes (Addendum)
Daily Progress Note   Patient Name: Sara Schmidt       Date: 06/09/2018 DOB: Oct 06, 1930  Age: 85 y.o. MRN#: [id number] Attending Physician: Glade Lloyd, MD Primary Care Physician: Renford Dills, MD Admit Date: 06/06/2018  Reason for Consultation/Follow-up: Establishing goals of care  Subjective: Patient cheerfully requests orange juice, she is watching Joni Fears.  She is slightly confused but makes jokes. She is on 8L of oxygen.    We talked with her brothers Kem [place]s and [place]) at bedside.  Cassanda does have 1 son Leo Grosser) and 1 daughter Para March).  Neither of them live locally and neither of them have communication with their mother.  That is why JC is Gaffer.  Her JC, Talore is at her baseline.  She actually is more interactive and animated than she normally is at the skilled nursing facility.  She has lived at the skilled nursing facility for several years.  She is wheelchair-bound and dependent for ADLs.  JC talked a lot about funeral expenses.  He expressed that Steffie has lived longer than he expected her to live.  Jan Fireman, PA-S explained Aleiyah's health conditions including pneumonia, heart failure, dementia.  Then she described thoracentesis.  She explained that even if fluid from the lungs is drained, it is very likely to come back due to heart failure.  The brothers were in support of moving forward with thoracentesis in order to make Goddess more comfortable.  Finally we described hospice services at skilled nursing facility.  The brothers were in support of receiving hospice services at [facility].  They felt it would increase Brizza's happiness and comfort.  They understand that hospice is usually called in the last several months of life and focuses  on a patient's comfort and happiness rather than on aggressive medical intervention.  Finally we expressed that we hoped Mirha would continue to improve but that we are concerned that she may not improve and is in danger of declining rapidly.  Her brothers understood and they are appreciative of the care she is receiving.  Assessment: Pleasantly demented female with respiratory distress requiring 8L nasal canula   Patient Profile/HPI:  85 y.o. female from [facility] SNF with past medical history of CVA with resulting hemiplegia, DM, D-HF, COPD, obesity hypoventilation syndrome and depression who was admitted  on 06/06/2018 with altered mental status, respiratory failure and acute on chronic heart failure.   The patient has had difficulty with volume overload but has been unable to be diuresed as her blood pressure is too soft.  Of note the patient's brother Ian MalkinJ.C. is her surrogate Management consultantdecision maker.  He does not have a car and it is somewhat difficult to come to the hospital.    Length of Stay: 2  Current Medications: Scheduled Meds:  . acetaminophen  650 mg Oral Q12H  . aspirin EC  81 mg Oral Daily  . atorvastatin  10 mg Oral QHS  . cholecalciferol  2,000 Units Oral Daily  . ferrous sulfate  325 mg Oral Q breakfast  . heparin  5,000 Units Subcutaneous Q8H  . insulin aspart  0-15 Units Subcutaneous TID WC  . insulin glargine  32 Units Subcutaneous QHS  . insulin glargine  35 Units Subcutaneous Daily  . magnesium gluconate  500 mg Oral TID  . pantoprazole  40 mg Oral Daily  . polyvinyl alcohol  1 drop Both Eyes QID  . QUEtiapine  50 mg Oral QHS    Continuous Infusions: . sodium chloride 10 mL/hr at 06/09/18 0700  . ceFEPime (MAXIPIME) IV 2 g (06/09/18 0916)    PRN Meds: calcium carbonate, ipratropium-albuterol  Physical Exam       Well developed woman, laughing and confused, speaking clearly and requesting more to drink CV rrr no frank m/r/g Resp no distress, no frank  w/c/r Abdomen soft, nt,nd active bowel sounds Lower ext with trace edema.  Vital Signs: BP (!) 108/55 (BP Location: Left Arm)   Pulse 83   Temp 98.2 F (36.8 C) (Oral)   Resp 18   Ht 5\' 5"  (1.651 m)   Wt 105 kg   LMP  (Exact Date)   SpO2 91%   BMI 38.52 kg/m  SpO2: SpO2: 91 % O2 Device: O2 Device: Nasal Cannula O2 Flow Rate: O2 Flow Rate (L/min): 8 L/min  Intake/output summary:   Intake/Output Summary (Last 24 hours) at 06/09/2018 1406 Last data filed at 06/09/2018 [id number] Gross per 24 hour  Intake 935.01 ml  Output 550 ml  Net 385.01 ml   LBM: Last BM Date: 06/07/18 Baseline Weight: Weight: 101.5 kg Most recent weight: Weight: 105 kg       Palliative Assessment/Data: 30%    Flowsheet Rows     Most Recent Value  Intake Tab  Referral Department  Hospitalist  Unit at Time of Referral  Intermediate Care Unit  Palliative Care Primary Diagnosis  Cardiac  Date Notified  06/08/18  Palliative Care Type  New Palliative care  Reason for referral  Clarify Goals of Care  Date of Admission  06/06/18  Date first seen by Palliative Care  06/08/18  # of days Palliative referral response time  0 Day(s)  # of days IP prior to Palliative referral  2  Clinical Assessment  Psychosocial & Spiritual Assessment  Palliative Care Outcomes      Patient Active Problem List   Diagnosis Date Noted  . Pressure injury of skin 06/08/2018  . Pleural effusion   . Palliative care encounter   . Acute on chronic respiratory failure (HCC) 06/07/2018  . Neck pain 06/07/2018  . AMS (altered mental status) 06/07/2018  . AKI (acute kidney injury) (HCC) 06/07/2018  . Acute respiratory failure with hypoxia (HCC) 06/07/2018  . COPD exacerbation (HCC)   . Dyspnea   . Hypoxia   . HCAP (healthcare-associated pneumonia)  11/17/2015  . Essential hypertension 11/17/2015  . Diabetes mellitus type 2, controlled (HCC) 11/17/2015  . OSA on CPAP 11/17/2015  . Pneumonia 11/17/2015    Palliative Care Plan     Recommendations/Plan:  Per JC (brother and surrogate decision maker) please continue current treatment with antibiotics and thoracentesis  After the patient's pneumonia has been treated and she is medically optimized please send her back to [facility] with hospice services for extra support  Goals of Care and Additional Recommendations:  Limitations on Scope of Treatment: Full Scope Treatment, then discharge with hospice services once medically optimized.  Code Status:  DNR  Prognosis:  < 6 months given immobility, bedbound status, worsening heart failure and respiratory status.  Current pleural effusion is likely to recur due to heart failure causing ongoing respiratory distress.  Discharge Planning:  Skilled Nursing Facility with Hospice  Care plan was discussed with attending, and family  Thank you for allowing the Palliative Medicine Team to assist in the care of this patient.  Total time spent:  60 min.      Greater than 50%  of this time was spent counseling and coordinating care related to the above assessment and plan.  Norvel Richards, PA-C Palliative Medicine  Please contact Palliative MedicineTeam phone at [phone] for questions and concerns between 7 am - 7 pm.   Please see AMION for individual provider pager numbers.

## 2018-06-09 NOTE — Care Management Important Message (Signed)
Important Message  Patient Details  Name: Sara Schmidt MRN: [id number] Date of Birth: November 30, 1930   Medicare Important Message Given:  Yes    Dorena Bodo 06/09/2018, 4:37 PM

## 2018-06-09 NOTE — Progress Notes (Signed)
RT entered pt room to offer CPAP for the night pt very confused and will not be able to tolerate CPAP. RT will continue to monitor.

## 2018-06-10 ENCOUNTER — Inpatient Hospital Stay (HOSPITAL_COMMUNITY): Payer: Medicare Other

## 2018-06-10 LAB — BODY FLUID CELL COUNT WITH DIFFERENTIAL
Eos, Fluid: 0 %
Lymphs, Fluid: 96 %
Monocyte-Macrophage-Serous Fluid: 2 % — ABNORMAL LOW (ref 50–90)
Neutrophil Count, Fluid: 2 % (ref 0–25)
Total Nucleated Cell Count, Fluid: 1493 cu mm — ABNORMAL HIGH (ref 0–1000)

## 2018-06-10 LAB — GRAM STAIN

## 2018-06-10 LAB — GLUCOSE, CAPILLARY
GLUCOSE-CAPILLARY: 150 mg/dL — AB (ref 70–99)
Glucose-Capillary: 61 mg/dL — ABNORMAL LOW (ref 70–99)
Glucose-Capillary: 78 mg/dL (ref 70–99)
Glucose-Capillary: 102 mg/dL — ABNORMAL HIGH (ref 70–99)
Glucose-Capillary: 114 mg/dL — ABNORMAL HIGH (ref 70–99)
Glucose-Capillary: 169 mg/dL — ABNORMAL HIGH (ref 70–99)

## 2018-06-10 LAB — CBC WITH DIFFERENTIAL/PLATELET
Abs Immature Granulocytes: 0.02 10*3/uL (ref 0.00–0.07)
Basophils Absolute: 0 10*3/uL (ref 0.0–0.1)
Basophils Relative: 0 %
EOS PCT: 4 %
Eosinophils Absolute: 0.2 10*3/uL (ref 0.0–0.5)
HCT: 34.3 % — ABNORMAL LOW (ref 36.0–46.0)
Hemoglobin: 10.3 g/dL — ABNORMAL LOW (ref 12.0–15.0)
Immature Granulocytes: 0 %
Lymphocytes Relative: 30 %
Lymphs Abs: 1.6 10*3/uL (ref 0.7–4.0)
MCH: 26.8 pg (ref 26.0–34.0)
MCHC: 30 g/dL (ref 30.0–36.0)
MCV: 89.1 fL (ref 80.0–100.0)
Monocytes Absolute: 1 10*3/uL (ref 0.1–1.0)
Monocytes Relative: 19 %
Neutro Abs: 2.4 10*3/uL (ref 1.7–7.7)
Neutrophils Relative %: 47 %
Platelets: 167 10*3/uL (ref 150–400)
RBC: 3.85 MIL/uL — AB (ref 3.87–5.11)
RDW: 15.9 % — ABNORMAL HIGH (ref 11.5–15.5)
WBC: 5.2 10*3/uL (ref 4.0–10.5)
nRBC: 0 % (ref 0.0–0.2)

## 2018-06-10 LAB — LACTATE DEHYDROGENASE, PLEURAL OR PERITONEAL FLUID: LD FL: 67 U/L — AB (ref 3–23)

## 2018-06-10 LAB — BASIC METABOLIC PANEL
Anion gap: 10 (ref 5–15)
BUN: 26 mg/dL — AB (ref 8–23)
CO2: 32 mmol/L (ref 22–32)
CREATININE: 1.33 mg/dL — AB (ref 0.44–1.00)
Calcium: 8.3 mg/dL — ABNORMAL LOW (ref 8.9–10.3)
Chloride: 101 mmol/L (ref 98–111)
GFR calc Af Amer: 42 mL/min — ABNORMAL LOW (ref >60)
GFR calc non Af Amer: 36 mL/min — ABNORMAL LOW (ref >60)
Glucose, Bld: 52 mg/dL — ABNORMAL LOW (ref 70–99)
Potassium: 3.6 mmol/L (ref 3.5–5.1)
Sodium: 143 mmol/L (ref 135–145)

## 2018-06-10 LAB — MAGNESIUM: Magnesium: 2.7 mg/dL — ABNORMAL HIGH (ref 1.7–2.4)

## 2018-06-10 LAB — PROTEIN, PLEURAL OR PERITONEAL FLUID: Total protein, fluid: <3 g/dL

## 2018-06-10 MED ORDER — FUROSEMIDE 40 MG PO TABS: 40.0000 mg | ORAL_TABLET | Freq: Every day | ORAL | Status: DC

## 2018-06-10 MED ORDER — FUROSEMIDE 10 MG/ML IJ SOLN
60.0000 mg | Freq: Once | INTRAMUSCULAR | Status: AC
  Administered 2018-06-10: 60 mg via INTRAVENOUS
  Filled 2018-06-10: qty 6

## 2018-06-10 MED ORDER — LIDOCAINE HCL (PF) 1 % IJ SOLN
INTRAMUSCULAR | Status: AC
  Filled 2018-06-10: qty 30

## 2018-06-10 MED ORDER — INSULIN GLARGINE 100 UNIT/ML ~~LOC~~ SOLN
15.0000 [IU] | Freq: Every day | SUBCUTANEOUS | Status: DC
  Administered 2018-06-10 – 2018-06-12 (×3): 15 [IU] via SUBCUTANEOUS
  Filled 2018-06-10 (×3): qty 0.15

## 2018-06-10 MED ORDER — INSULIN GLARGINE 100 UNIT/ML ~~LOC~~ SOLN
15.0000 [IU] | Freq: Every day | SUBCUTANEOUS | Status: DC
  Administered 2018-06-11 – 2018-06-13 (×3): 15 [IU] via SUBCUTANEOUS
  Filled 2018-06-10 (×3): qty 0.15

## 2018-06-10 MED ORDER — ORAL CARE MOUTH RINSE
15.0000 mL | Freq: Two times a day (BID) | OROMUCOSAL | Status: DC
  Administered 2018-06-09 – 2018-06-13 (×7): 15 mL via OROMUCOSAL

## 2018-06-10 NOTE — Progress Notes (Signed)
Briefly visited with Sara Schmidt.  She was going ultrasound for thoracentesis and reported "They are going to show me how many babies I'm going to have".    I called JC to update him.  Recommendation:  Treat the treatable with minimally invasive measures.    Discharge to SNF with Hospice when medically appropriate.  Norvel Richards, PA-C Palliative Medicine Pager: [phone]  No charge note

## 2018-06-10 NOTE — Procedures (Signed)
PROCEDURE SUMMARY:  Trace to small effusion on left, insufficient for thoracentesis Small effusion on right.  Successful US guided right thoracentesis. Yielded 380 mL of thin serosanguinous fluid. Pt tolerated procedure well. No immediate complications.  Specimen was sent for labs. CXR ordered.  EBL < 5 mL  Brayton El PA-C 06/10/2018 2:14 PM

## 2018-06-10 NOTE — Progress Notes (Signed)
Patient ID: Sara Schmidt, female   DOB: 29-Jan-1931, 85 y.o.   MRN: [id number]  PROGRESS NOTE    Indica Sokolski  HDI:[id number] DOB: 02-12-1931 DOA: 06/06/2018 PCP: Renford Dills, MD   Brief Narrative:  85 year old female with history of depression, diabetes, diastolic congestive heart failure, obesity hypoventilation syndrome, OSA, prior CVA with residual left hemiparesis presented from nursing home for evaluation of shortness of breath.  She was on Levaquin and Flagyl in the nursing home for treatment of pulmonary infection.  She was admitted with acute on chronic hypercarbic respiratory failure and initially was on nonrebreather.  Antibiotics were continued.  Also received intravenous Lasix.  Assessment & Plan:   Principal Problem:   Acute on chronic respiratory failure (HCC) Active Problems:   Diabetes mellitus type 2, controlled (HCC)   Neck pain   AMS (altered mental status)   AKI (acute kidney injury) (HCC)   Acute respiratory failure with hypoxia (HCC)   Pressure injury of skin   Pleural effusion   Palliative care encounter   Acute on chronic hypercarbic respiratory failure with respiratory acidosis secondary to pneumonia/acute on chronic diastolic congestive heart failure  -Patient was on nonrebreather on presentation.  Currently still on 8 L nasal cannula.   Probable healthcare associated pneumonia with bilateral pleural effusion -Continue cefepime.  Cultures have been negative so far. -Ultrasound-guided thoracentesis is pending.    Sepsis -Probably secondary to above.  Antibiotic plan as above.  Blood cultures negative so far  Hypotension -Improving.  Blood pressure stable at this time -Hold sedative medications altogether including nighttime Seroquel. -Overall prognosis is guarded to poor.  Palliative care evaluation appreciated.  As per their discussion with family members, family wants to continue current treatment with antibiotics including thoracentesis and on discharge  patient will go to SNF with hospice.  Acute on chronic diastolic congestive heart failure -Strict input and output.  Daily weight.  Echo showed EF of 60 to 65%. -Patient received IV Lasix on admission but subsequently has not been able to receive any more Lasix because of hypotension -Blood pressure better today.  Will try IV Lasix 1 dose today. -Ultrasound-guided thoracentesis pending.  Altered mental status -Unclear what her baseline mental status is.  She probably has baseline dementia.   -CT of the brain was negative for any new intracranial abnormality; has chronic old infarct with encephalomalacia -Has waxing and waning symptoms.  Probably back at her baseline now.  Monitor.  Will hold off on MRI.  acute kidney injury -monitor renal function.  Creatinine slightly better today.  Diabetes mellitus type 2 with hypoglycemia -We will decrease dose of Lantus.  Continue CBGs with sliding scale insulin  Hyperlipidemia -continue statin  Depression - duloxetine and Seroquel will be kept on hold  Neuropathy -Hold gabapentin and Lyrica because of mental status  GERD -continue PPI   DVT prophylaxis: Subcutaneous heparin Code Status: DNR Family Communication: None at bedside Disposition Plan: Discharge back to SNF in 1 to 3 days if clinically improved  Consultants: None  Procedures:  Echo on 06/07/2018 IMPRESSIONS    1. The left ventricle has normal systolic function with an ejection fraction of 60-65%. The cavity size was normal. There is moderately increased left ventricular wall thickness. Left ventricular diastolic Doppler parameters are consistent with  pseudonormalization secondary to atrial fibrillation.  2. The right ventricle has normal systolic function. The cavity was mildly enlarged. There is no increase in right ventricular wall thickness. Right ventricular systolic pressure is moderately elevated with an estimated  pressure of 35.2 mmHg.  3. The mitral valve is  normal in structure. There is mild thickening and mild calcification. There is mild to moderate mitral annular calcification present.  4. The tricuspid valve is normal in structure.  5. The aortic valve is normal in structure. There is mild thickening and mild calcification of the aortic valve. Aortic valve regurgitation is trivial by color flow Doppler.  6. The LCC is heavily calcified and is immobile.  7. Right atrial pressure is estimated at 8 mmHg.  8. The interatrial septum was not assessed.   Antimicrobials:  Anti-infectives (From admission, onward)   Start     Dose/Rate Route Frequency Ordered Stop   06/10/18 0800  ceFEPIme (MAXIPIME) 2 g in sodium chloride 0.9 % 100 mL IVPB     2 g 200 mL/hr over 30 Minutes Intravenous Every 24 hours 06/09/18 1441     06/08/18 1200  levofloxacin (LEVAQUIN) IVPB 500 mg  Status:  Discontinued     500 mg 66.7 mL/hr over 90 Minutes Intravenous Every 24 hours 06/07/18 1847 06/08/18 1145   06/08/18 1200  ceFEPIme (MAXIPIME) 2 g in sodium chloride 0.9 % 100 mL IVPB  Status:  Discontinued     2 g 200 mL/hr over 30 Minutes Intravenous Every 12 hours 06/08/18 1158 06/09/18 1441   06/07/18 1200  levofloxacin (LEVAQUIN) IVPB 750 mg  Status:  Discontinued     750 mg 100 mL/hr over 90 Minutes Intravenous Every 24 hours 06/07/18 0613 06/07/18 1847   06/07/18 0630  metronidazole (FLAGYL) IVPB 500 mg  Status:  Discontinued     500 mg 100 mL/hr over 60 Minutes Intravenous Every 8 hours 06/07/18 1478 06/08/18 1145      Subjective: Patient seen and examined at bedside.  She is awake, having her breakfast, pleasantly confused.  No overnight fever, nausea or vomiting reported. Objective: Vitals:   06/09/18 1346 06/09/18 2147 06/10/18 0449 06/10/18 0500  BP: (!) 108/55 121/71 127/61   Pulse: 83 98 98   Resp: 18 18 18    Temp: 98.2 F (36.8 C) 98.9 F (37.2 C) 98.7 F (37.1 C)   TempSrc: Oral Oral Oral   SpO2: 91% 95% 95%   Weight:    104.7 kg  Height:         Intake/Output Summary (Last 24 hours) at 06/10/2018 1153 Last data filed at 06/10/2018 0700 Gross per 24 hour  Intake 871.3 ml  Output 500 ml  Net 371.3 ml   Filed Weights   06/08/18 0500 06/09/18 0500 06/10/18 0500  Weight: 102.1 kg 105 kg 104.7 kg    Examination:  General exam: Elderly female, lying in bed.  No distress.  Awake, pleasantly confused Respiratory system: Bilateral decreased breath sounds at bases with bibasilar crackles cardiovascular system:  S1-S2 heard, rate controlled Gastrointestinal system: Abdomen is nondistended, soft and nontender. Normal bowel sounds heard. Extremities: No cyanosis; lower extremity edema   Data Reviewed: I have personally reviewed following labs and imaging studies  CBC: Recent Labs  Lab 06/06/18 1952 06/06/18 2020 06/07/18 0916 06/08/18 0357 06/09/18 0351 06/10/18 0532  WBC 6.0  --  7.5 5.1 6.3 5.2  NEUTROABS 3.5  --  5.0 3.3 3.7 2.4  HGB 10.2* 11.2* 11.3* 10.3* 10.3* 10.3*  HCT 35.8* 33.0* 37.8 37.2 35.5* 34.3*  MCV 93.0  --  92.2 95.4 91.7 89.1  PLT 176  --  146* 160 166 167   Basic Metabolic Panel: Recent Labs  Lab 06/06/18 1952 06/06/18 2020  06/07/18 0916 06/08/18 0357 06/09/18 0351 06/10/18 0532  NA 143 143 145 143 144 143  K 4.2 4.2 4.5 4.3 4.0 3.6  CL 97*  --  96* 100 102 101  CO2 36*  --  35* 37* 34* 32  GLUCOSE 142*  --  126* 152* 85 52*  BUN 29*  --  30* 30* 33* 26*  CREATININE 1.19*  --  1.09* 1.48* 1.55* 1.33*  CALCIUM 8.7*  --  8.7* 8.1* 8.0* 8.3*  MG  --   --  2.6* 2.6* 2.7* 2.7*   GFR: Estimated Creatinine Clearance: 37.2 mL/min (A) (by C-G formula based on SCr of 1.33 mg/dL (H)). Liver Function Tests: Recent Labs  Lab 06/06/18 1952 06/07/18 0916  AST 17 16  ALT 15 14  ALKPHOS 28* 26*  BILITOT 0.9 0.8  PROT 5.9* 6.2*  ALBUMIN 3.2* 3.3*   No results for input(s): LIPASE, AMYLASE in the last 168 hours. No results for input(s): AMMONIA in the last 168 hours. Coagulation Profile: No  results for input(s): INR, PROTIME in the last 168 hours. Cardiac Enzymes: No results for input(s): CKTOTAL, CKMB, CKMBINDEX, TROPONINI in the last 168 hours. BNP (last 3 results) No results for input(s): PROBNP in the last 8760 hours. HbA1C: No results for input(s): HGBA1C in the last 72 hours. CBG: Recent Labs  Lab 06/09/18 1725 06/09/18 2052 06/10/18 0636 06/10/18 0655 06/10/18 0759  GLUCAP 86 90 61* 78 102*   Lipid Profile: No results for input(s): CHOL, HDL, LDLCALC, TRIG, CHOLHDL, LDLDIRECT in the last 72 hours. Thyroid Function Tests: No results for input(s): TSH, T4TOTAL, FREET4, T3FREE, THYROIDAB in the last 72 hours. Anemia Panel: No results for input(s): VITAMINB12, FOLATE, FERRITIN, TIBC, IRON, RETICCTPCT in the last 72 hours. Sepsis Labs: Recent Labs  Lab 06/06/18 1952 06/06/18 2255 06/07/18 0644  PROCALCITON  --   --  <0.10  LATICACIDVEN 2.1* 1.5  --     Recent Results (from the past 240 hour(s))  Culture, blood (Routine X 2) w Reflex to ID Panel     Status: None (Preliminary result)   Collection Time: 06/06/18  8:15 PM  Result Value Ref Range Status   Specimen Description BLOOD LEFT HAND  Final   Special Requests   Final    BOTTLES DRAWN AEROBIC AND ANAEROBIC Blood Culture adequate volume   Culture   Final    NO GROWTH 4 DAYS Performed at [facility], [address] [place], [place] [address]    Report Status PENDING  Incomplete  Culture, blood (Routine X 2) w Reflex to ID Panel     Status: None (Preliminary result)   Collection Time: 06/06/18  8:20 PM  Result Value Ref Range Status   Specimen Description BLOOD SITE NOT SPECIFIED  Final   Special Requests   Final    BOTTLES DRAWN AEROBIC AND ANAEROBIC Blood Culture adequate volume   Culture   Final    NO GROWTH 4 DAYS Performed at [facility], [address] [place], [place] [address]    Report Status PENDING  Incomplete  Urine Culture     Status: Abnormal   Collection Time:  06/07/18  3:27 PM  Result Value Ref Range Status   Specimen Description URINE, CLEAN CATCH  Final   Special Requests   Final    NONE Performed at [facility], [address] [place], [place] [address]    Culture MULTIPLE SPECIES PRESENT, SUGGEST RECOLLECTION (A)  Final  Report Status 06/08/2018 FINAL  Final  MRSA PCR Screening     Status: None   Collection Time: 06/09/18  1:52 AM  Result Value Ref Range Status   MRSA by PCR NEGATIVE NEGATIVE Final    Comment:        The GeneXpert MRSA Assay (FDA approved for NASAL specimens only), is one component of a comprehensive MRSA colonization surveillance program. It is not intended to diagnose MRSA infection nor to guide or monitor treatment for MRSA infections. Performed at [facility], [address], [place], [place] [address]          Radiology Studies: Ct Head Wo Contrast  Result Date: 06/08/2018 CLINICAL DATA:  Altered mental status. EXAM: CT HEAD WITHOUT CONTRAST TECHNIQUE: Contiguous axial images were obtained from the base of the skull through the vertex without intravenous contrast. COMPARISON:  Brain MRI 11/13/2003 FINDINGS: Brain: No evidence of acute hemorrhage, hydrocephalus, or mass lesion/mass effect. An area of encephalomalacia within the vascular territory of the right MCA appears larger than on the comparison MRI dated 11/13/2003. This may represent matured ischemic changes, however reinfarction in the periphery of this lesion is difficult to exclude. Deep white matter microangiopathy. Vascular: Calcific atherosclerotic disease of the intra cavernous carotid arteries and bilateral MCA. Skull: Normal. Negative for fracture or focal lesion. Sinuses/Orbits: No acute finding. Other: None. IMPRESSION: An area of encephalomalacia within the vascular territory of the right MCA appears larger than on the comparison MRI dated 11/13/2003. This may represent matured ischemic changes, however reinfarction in the  periphery of this lesion is difficult to exclude. Deep white matter microangiopathy. No evidence of intracranial hemorrhage. Electronically Signed   By: Ted Mcalpine M.D.   On: 06/08/2018 14:43   Dg Chest Port 1 View  Result Date: 06/09/2018 CLINICAL DATA:  Dyspnea EXAM: PORTABLE CHEST 1 VIEW COMPARISON:  Yesterday FINDINGS: Cardiomegaly and vascular congestion. Hazy opacity at the bases from pleural fluid and airspace disease. No pneumothorax. IMPRESSION: Unchanged cardiomegaly and bilateral pleural effusion with lower lobe opacity. Electronically Signed   By: Marnee Spring M.D.   On: 06/09/2018 09:23        Scheduled Meds: . acetaminophen  650 mg Oral Q12H  . aspirin EC  81 mg Oral Daily  . atorvastatin  10 mg Oral QHS  . cholecalciferol  2,000 Units Oral Daily  . ferrous sulfate  325 mg Oral Q breakfast  . heparin  5,000 Units Subcutaneous Q8H  . insulin aspart  0-15 Units Subcutaneous TID WC  . insulin glargine  32 Units Subcutaneous QHS  . insulin glargine  35 Units Subcutaneous Daily  . magnesium gluconate  500 mg Oral TID  . mouth rinse  15 mL Mouth Rinse BID  . pantoprazole  40 mg Oral Daily  . polyvinyl alcohol  1 drop Both Eyes QID   Continuous Infusions: . sodium chloride 10 mL/hr at 06/10/18 0700  . ceFEPime (MAXIPIME) IV 2 g (06/10/18 0845)     LOS: 3 days        Glade Lloyd, MD [facility] Pager [phone]  If 7PM-7AM, please contact night-coverage [email or web address] Password [facility] 06/10/2018, 11:53 AM

## 2018-06-10 NOTE — Progress Notes (Signed)
Hypoglycemic Event  CBG: 52 with AM labs (RN not notified)  Treatment: 8 oz juice/soda  Symptoms: Patient having difficulty speaking.  Follow-up CBG: Time:0636 CBG Result:61  Possible Reasons for Event: Inadequate meal intake  Comments/MD notified:Kirby, NP notified via text page. Will continue to monitor.    Marigene Ehlers

## 2018-06-11 LAB — CBC WITH DIFFERENTIAL/PLATELET
Abs Immature Granulocytes: 0.02 10*3/uL (ref 0.00–0.07)
Basophils Absolute: 0 10*3/uL (ref 0.0–0.1)
Basophils Relative: 0 %
Eosinophils Absolute: 0.1 10*3/uL (ref 0.0–0.5)
Eosinophils Relative: 2 %
HCT: 39.6 % (ref 36.0–46.0)
Hemoglobin: 11.6 g/dL — ABNORMAL LOW (ref 12.0–15.0)
Immature Granulocytes: 0 %
Lymphocytes Relative: 24 %
Lymphs Abs: 1.1 10*3/uL (ref 0.7–4.0)
MCH: 26.4 pg (ref 26.0–34.0)
MCHC: 29.3 g/dL — ABNORMAL LOW (ref 30.0–36.0)
MCV: 90 fL (ref 80.0–100.0)
MONO ABS: 0.8 10*3/uL (ref 0.1–1.0)
Monocytes Relative: 18 %
NEUTROS ABS: 2.6 10*3/uL (ref 1.7–7.7)
Neutrophils Relative %: 56 %
Platelets: 172 10*3/uL (ref 150–400)
RBC: 4.4 MIL/uL (ref 3.87–5.11)
RDW: 15.7 % — ABNORMAL HIGH (ref 11.5–15.5)
WBC: 4.6 10*3/uL (ref 4.0–10.5)
nRBC: 0 % (ref 0.0–0.2)

## 2018-06-11 LAB — BASIC METABOLIC PANEL
Anion gap: 6 (ref 5–15)
BUN: 19 mg/dL (ref 8–23)
CO2: 38 mmol/L — ABNORMAL HIGH (ref 22–32)
Calcium: 8.7 mg/dL — ABNORMAL LOW (ref 8.9–10.3)
Chloride: 101 mmol/L (ref 98–111)
Creatinine, Ser: 1.13 mg/dL — ABNORMAL HIGH (ref 0.44–1.00)
GFR calc Af Amer: 51 mL/min — ABNORMAL LOW (ref >60)
GFR calc non Af Amer: 44 mL/min — ABNORMAL LOW (ref >60)
Glucose, Bld: 158 mg/dL — ABNORMAL HIGH (ref 70–99)
POTASSIUM: 4 mmol/L (ref 3.5–5.1)
Sodium: 145 mmol/L (ref 135–145)

## 2018-06-11 LAB — CULTURE, BLOOD (ROUTINE X 2)
CULTURE: NO GROWTH
Culture: NO GROWTH
SPECIAL REQUESTS: ADEQUATE
Special Requests: ADEQUATE

## 2018-06-11 LAB — GLUCOSE, CAPILLARY
Glucose-Capillary: 110 mg/dL — ABNORMAL HIGH (ref 70–99)
Glucose-Capillary: 205 mg/dL — ABNORMAL HIGH (ref 70–99)
Glucose-Capillary: 173 mg/dL — ABNORMAL HIGH (ref 70–99)
Glucose-Capillary: 169 mg/dL — ABNORMAL HIGH (ref 70–99)

## 2018-06-11 LAB — MAGNESIUM: Magnesium: 2.4 mg/dL (ref 1.7–2.4)

## 2018-06-11 MED ORDER — FUROSEMIDE 10 MG/ML IJ SOLN
60.0000 mg | Freq: Once | INTRAMUSCULAR | Status: AC
  Administered 2018-06-11: 60 mg via INTRAVENOUS
  Filled 2018-06-11: qty 6

## 2018-06-11 NOTE — Progress Notes (Signed)
Patient ID: Sara Schmidt, female   DOB: November 14, 1930, 85 y.o.   MRN: [id number]  PROGRESS NOTE    Blakely Maranan  WUJ:[id number] DOB: Dec 12, 1930 DOA: 06/06/2018 PCP: Renford Dills, MD   Brief Narrative:  85 year old female with history of depression, diabetes, diastolic congestive heart failure, obesity hypoventilation syndrome, OSA, prior CVA with residual left hemiparesis presented from nursing home for evaluation of shortness of breath.  She was on Levaquin and Flagyl in the nursing home for treatment of pulmonary infection.  She was admitted with acute on chronic hypercarbic respiratory failure and initially was on nonrebreather.  Antibiotics were continued.  Also received intravenous Lasix.  Assessment & Plan:   Principal Problem:   Acute on chronic respiratory failure (HCC) Active Problems:   Diabetes mellitus type 2, controlled (HCC)   Neck pain   AMS (altered mental status)   AKI (acute kidney injury) (HCC)   Acute respiratory failure with hypoxia (HCC)   Pressure injury of skin   Pleural effusion   Palliative care encounter   Acute on chronic hypercarbic respiratory failure with respiratory acidosis secondary to pneumonia/acute on chronic diastolic congestive heart failure  -Patient was on nonrebreather on presentation.  Currently still on 8 L nasal cannula.   Probable healthcare associated pneumonia with bilateral pleural effusion -Continue cefepime.  Cultures have been negative so far. -Right-sided ultrasound-guided thoracentesis with removal of 380 cc fluid on 06/10/2018.  Trace to small effusion on left: Insufficient for thoracentesis  Sepsis -Probably secondary to above.  Antibiotic plan as above.  Blood cultures negative so far  Hypotension -Improved. -sedative medications held altogether including nighttime Seroquel. -Overall prognosis is guarded to poor.  Palliative care evaluation appreciated.  As per their discussion with family members, family wants to continue  current treatment with antibiotics including thoracentesis and on discharge patient will go to SNF with hospice.  Acute on chronic diastolic congestive heart failure -Strict input and output.  Daily weight.  Echo showed EF of 60 to 65%. -Blood pressure much better today.  Received 1 dose of IV Lasix again yesterday.  We will give Lasix 60 mg IV x1 again this morning.  Altered mental status -Unclear what her baseline mental status is.  She probably has baseline dementia.   -CT of the brain was negative for any new intracranial abnormality; has chronic old infarct with encephalomalacia -Has waxing and waning symptoms.  Probably back at her baseline now.  Monitor.  Will hold off on MRI.  acute kidney injury -monitor renal function.  Improving.  Diabetes mellitus type 2 with hypoglycemia -Continue Lantus .continue CBGs with sliding scale insulin  Hyperlipidemia -continue statin  Depression - duloxetine and Seroquel will be kept on hold  Neuropathy -Hold gabapentin and Lyrica because of mental status  GERD -continue PPI   DVT prophylaxis: Subcutaneous heparin Code Status: DNR Family Communication: None at bedside Disposition Plan: Discharge back to SNF in 1 to 3 days if clinically improved  Consultants: None  Procedures:  Echo on 06/07/2018 IMPRESSIONS    1. The left ventricle has normal systolic function with an ejection fraction of 60-65%. The cavity size was normal. There is moderately increased left ventricular wall thickness. Left ventricular diastolic Doppler parameters are consistent with  pseudonormalization secondary to atrial fibrillation.  2. The right ventricle has normal systolic function. The cavity was mildly enlarged. There is no increase in right ventricular wall thickness. Right ventricular systolic pressure is moderately elevated with an estimated pressure of 35.2 mmHg.  3. The mitral valve  is normal in structure. There is mild thickening and mild  calcification. There is mild to moderate mitral annular calcification present.  4. The tricuspid valve is normal in structure.  5. The aortic valve is normal in structure. There is mild thickening and mild calcification of the aortic valve. Aortic valve regurgitation is trivial by color flow Doppler.  6. The LCC is heavily calcified and is immobile.  7. Right atrial pressure is estimated at 8 mmHg.  8. The interatrial septum was not assessed.   Antimicrobials:  Anti-infectives (From admission, onward)   Start     Dose/Rate Route Frequency Ordered Stop   06/10/18 0800  ceFEPIme (MAXIPIME) 2 g in sodium chloride 0.9 % 100 mL IVPB     2 g 200 mL/hr over 30 Minutes Intravenous Every 24 hours 06/09/18 1441     06/08/18 1200  levofloxacin (LEVAQUIN) IVPB 500 mg  Status:  Discontinued     500 mg 66.7 mL/hr over 90 Minutes Intravenous Every 24 hours 06/07/18 1847 06/08/18 1145   06/08/18 1200  ceFEPIme (MAXIPIME) 2 g in sodium chloride 0.9 % 100 mL IVPB  Status:  Discontinued     2 g 200 mL/hr over 30 Minutes Intravenous Every 12 hours 06/08/18 1158 06/09/18 1441   06/07/18 1200  levofloxacin (LEVAQUIN) IVPB 750 mg  Status:  Discontinued     750 mg 100 mL/hr over 90 Minutes Intravenous Every 24 hours 06/07/18 0613 06/07/18 1847   06/07/18 0630  metronidazole (FLAGYL) IVPB 500 mg  Status:  Discontinued     500 mg 100 mL/hr over 60 Minutes Intravenous Every 8 hours 06/07/18 [id number] 06/08/18 1145       Subjective: Patient seen and examined at bedside.  She is awake, pleasantly confused.  No overnight fever, nausea, vomiting.   Objective: Vitals:   06/10/18 2200 06/10/18 2238 06/11/18 0500 06/11/18 0529  BP:  132/68  117/74  Pulse:  89  72  Resp:  19    Temp:  98.9 F (37.2 C)  97.9 F (36.6 C)  TempSrc:  Oral  Oral  SpO2: 96% 99%  100%  Weight:   101.2 kg   Height:        Intake/Output Summary (Last 24 hours) at 06/11/2018 0918 Last data filed at 06/11/2018 0700 Gross per 24 hour    Intake 469.92 ml  Output 1400 ml  Net -930.08 ml   Filed Weights   06/09/18 0500 06/10/18 0500 06/11/18 0500  Weight: 105 kg 104.7 kg 101.2 kg    Examination:  General exam: Elderly female, lying in bed.  No acute distress.  Awake, pleasantly confused Respiratory system: Bilateral decreased breath sounds at bases with bibasilar crackles, no wheezing  cardiovascular system: Rate controlled, S1-S2 heard Gastrointestinal system: Abdomen is nondistended, soft and nontender. Normal bowel sounds heard. Extremities: No cyanosis; lower extremity edema   Data Reviewed: I have personally reviewed following labs and imaging studies  CBC: Recent Labs  Lab 06/07/18 0916 06/08/18 0357 06/09/18 0351 06/10/18 0532 06/11/18 0445  WBC 7.5 5.1 6.3 5.2 4.6  NEUTROABS 5.0 3.3 3.7 2.4 2.6  HGB 11.3* 10.3* 10.3* 10.3* 11.6*  HCT 37.8 37.2 35.5* 34.3* 39.6  MCV 92.2 95.4 91.7 89.1 90.0  PLT 146* 160 166 167 172   Basic Metabolic Panel: Recent Labs  Lab 06/07/18 0916 06/08/18 0357 06/09/18 0351 06/10/18 0532 06/11/18 0445  NA 145 143 144 143 145  K 4.5 4.3 4.0 3.6 4.0  CL 96* 100 102 101 101  CO2 35* 37* 34* 32 38*  GLUCOSE 126* 152* 85 52* 158*  BUN 30* 30* 33* 26* 19  CREATININE 1.09* 1.48* 1.55* 1.33* 1.13*  CALCIUM 8.7* 8.1* 8.0* 8.3* 8.7*  MG 2.6* 2.6* 2.7* 2.7* 2.4   GFR: Estimated Creatinine Clearance: 42.9 mL/min (A) (by C-G formula based on SCr of 1.13 mg/dL (H)). Liver Function Tests: Recent Labs  Lab 06/06/18 1952 06/07/18 0916  AST 17 16  ALT 15 14  ALKPHOS 28* 26*  BILITOT 0.9 0.8  PROT 5.9* 6.2*  ALBUMIN 3.2* 3.3*   No results for input(s): LIPASE, AMYLASE in the last 168 hours. No results for input(s): AMMONIA in the last 168 hours. Coagulation Profile: No results for input(s): INR, PROTIME in the last 168 hours. Cardiac Enzymes: No results for input(s): CKTOTAL, CKMB, CKMBINDEX, TROPONINI in the last 168 hours. BNP (last 3 results) No results for  input(s): PROBNP in the last 8760 hours. HbA1C: No results for input(s): HGBA1C in the last 72 hours. CBG: Recent Labs  Lab 06/10/18 0759 06/10/18 1217 06/10/18 1706 06/10/18 2245 06/11/18 0809  GLUCAP 102* 114* 169* 150* 110*   Lipid Profile: No results for input(s): CHOL, HDL, LDLCALC, TRIG, CHOLHDL, LDLDIRECT in the last 72 hours. Thyroid Function Tests: No results for input(s): TSH, T4TOTAL, FREET4, T3FREE, THYROIDAB in the last 72 hours. Anemia Panel: No results for input(s): VITAMINB12, FOLATE, FERRITIN, TIBC, IRON, RETICCTPCT in the last 72 hours. Sepsis Labs: Recent Labs  Lab 06/06/18 1952 06/06/18 2255 06/07/18 0644  PROCALCITON  --   --  <0.10  LATICACIDVEN 2.1* 1.5  --     Recent Results (from the past 240 hour(s))  Culture, blood (Routine X 2) w Reflex to ID Panel     Status: None   Collection Time: 06/06/18  8:15 PM  Result Value Ref Range Status   Specimen Description BLOOD LEFT HAND  Final   Special Requests   Final    BOTTLES DRAWN AEROBIC AND ANAEROBIC Blood Culture adequate volume   Culture   Final    NO GROWTH 5 DAYS Performed at [facility], [address] [place], [place] [address]    Report Status 06/11/2018 FINAL  Final  Culture, blood (Routine X 2) w Reflex to ID Panel     Status: None   Collection Time: 06/06/18  8:20 PM  Result Value Ref Range Status   Specimen Description BLOOD SITE NOT SPECIFIED  Final   Special Requests   Final    BOTTLES DRAWN AEROBIC AND ANAEROBIC Blood Culture adequate volume   Culture   Final    NO GROWTH 5 DAYS Performed at [facility], [address] [place], [place] [address]    Report Status 06/11/2018 FINAL  Final  Urine Culture     Status: Abnormal   Collection Time: 06/07/18  3:27 PM  Result Value Ref Range Status   Specimen Description URINE, CLEAN CATCH  Final   Special Requests   Final    NONE Performed at [facility], [address] [place], [place] [address]    Culture  MULTIPLE SPECIES PRESENT, SUGGEST RECOLLECTION (A)  Final   Report Status 06/08/2018 FINAL  Final  MRSA PCR Screening     Status: None   Collection Time: 06/09/18  1:52 AM  Result Value Ref Range Status   MRSA by PCR NEGATIVE NEGATIVE Final    Comment:        The GeneXpert MRSA Assay (FDA approved  for NASAL specimens only), is one component of a comprehensive MRSA colonization surveillance program. It is not intended to diagnose MRSA infection nor to guide or monitor treatment for MRSA infections. Performed at [facility], [address] [place], [place] [address]   Culture, body fluid-bottle     Status: None (Preliminary result)   Collection Time: 06/10/18  2:18 PM  Result Value Ref Range Status   Specimen Description PLEURAL RIGHT  Final   Special Requests NONE  Final   Culture   Final    NO GROWTH < 24 HOURS Performed at [facility], [address], [place], [place] [address]    Report Status PENDING  Incomplete  Gram stain     Status: None   Collection Time: 06/10/18  2:18 PM  Result Value Ref Range Status   Specimen Description PLEURAL RIGHT  Final   Special Requests NONE  Final   Gram Stain   Final    ABUNDANT WBC PRESENT, PREDOMINANTLY MONONUCLEAR NO ORGANISMS SEEN Performed at [facility], [address], [place], [place] [address]    Report Status 06/10/2018 FINAL  Final         Radiology Studies: Dg Chest 1 View  Result Date: 06/10/2018 CLINICAL DATA:  Post right-sided thoracentesis EXAM: CHEST  1 VIEW COMPARISON:  06/09/2018; 06/08/2018 FINDINGS: Interval reduction/resolution of right-sided pleural effusion post thoracentesis. No pneumothorax. Improved aeration the right lung base. Grossly unchanged enlarged cardiac silhouette and mediastinal contours with atherosclerotic plaque when the thoracic aorta. Unchanged small left-sided effusion with associated left basilar heterogeneous/consolidative opacities. Pulmonary vasculature remains  indistinct with cephalization of flow. No acute osseous abnormalities. IMPRESSION: 1. Interval reduction/resolution of right-sided effusion post thoracentesis. No pneumothorax. 2. Improved aeration of the right lung base. 3. Similar findings of cardiomegaly, pulmonary edema, small left-sided effusion associated left basilar opacities, likely atelectasis. Electronically Signed   By: Simonne Come M.D.   On: 06/10/2018 14:37   US Thoracentesis Asp Pleural Space W/img Guide  Result Date: 06/10/2018 INDICATION: Shortness of breath. Bilateral pleural effusions by recent imaging. Request diagnostic and therapeutic thoracentesis. EXAM: ULTRASOUND GUIDED RIGHT THORACENTESIS MEDICATIONS: None. COMPLICATIONS: None immediate. PROCEDURE: An ultrasound guided thoracentesis was thoroughly discussed with the patient and questions answered. The benefits, risks, alternatives and complications were also discussed. The patient understands and wishes to proceed with the procedure. Written consent was obtained. Ultrasound of the left chest demonstrates trace to small pleural effusion, insufficient for safe thoracentesis. Ultrasound of the right chest demonstrates small pleural effusion, window amenable for safe thoracentesis. Ultrasound was performed to localize and mark an adequate pocket of fluid in the right chest. The area was then prepped and draped in the normal sterile fashion. 1% Lidocaine was used for local anesthesia. Under ultrasound guidance a 6 Fr Safe-T-Centesis catheter was introduced. Thoracentesis was performed. The catheter was removed and a dressing applied. FINDINGS: A total of approximately 380 mL of thin, serosanguineous fluid was removed. Samples were sent to the laboratory as requested by the clinical team. IMPRESSION: Successful ultrasound guided right thoracentesis yielding 380 mL of pleural fluid. Read by: Brayton El PA-C Electronically Signed   By: Simonne Come M.D.   On: 06/10/2018 14:26         Scheduled Meds: . acetaminophen  650 mg Oral Q12H  . aspirin EC  81 mg Oral Daily  . atorvastatin  10 mg Oral QHS  . cholecalciferol  2,000 Units Oral Daily  . ferrous sulfate  325  mg Oral Q breakfast  . heparin  5,000 Units Subcutaneous Q8H  . insulin aspart  0-15 Units Subcutaneous TID WC  . insulin glargine  15 Units Subcutaneous QHS  . insulin glargine  15 Units Subcutaneous Daily  . magnesium gluconate  500 mg Oral TID  . mouth rinse  15 mL Mouth Rinse BID  . pantoprazole  40 mg Oral Daily  . polyvinyl alcohol  1 drop Both Eyes QID   Continuous Infusions: . sodium chloride 10 mL/hr at 06/10/18 1245  . ceFEPime (MAXIPIME) IV 2 g (06/10/18 0845)     LOS: 4 days        Glade Lloyd, MD [facility] Pager [phone]  If 7PM-7AM, please contact night-coverage [email or web address] Password [facility] 06/11/2018, 9:18 AM

## 2018-06-11 NOTE — Progress Notes (Signed)
Pt offered use of hosp cpap for hs.  Pt states she does not use hers at home and that she doesn't want to use ours.  Equipment will be available if pt reconsiders.

## 2018-06-12 LAB — GLUCOSE, CAPILLARY
Glucose-Capillary: 126 mg/dL — ABNORMAL HIGH (ref 70–99)
Glucose-Capillary: 145 mg/dL — ABNORMAL HIGH (ref 70–99)
Glucose-Capillary: 191 mg/dL — ABNORMAL HIGH (ref 70–99)
Glucose-Capillary: 137 mg/dL — ABNORMAL HIGH (ref 70–99)

## 2018-06-12 MED ORDER — ONDANSETRON HCL 4 MG/2ML IJ SOLN
4.0000 mg | Freq: Four times a day (QID) | INTRAMUSCULAR | Status: DC | PRN
  Filled 2018-06-12: qty 2

## 2018-06-12 MED ORDER — FUROSEMIDE 10 MG/ML IJ SOLN
60.0000 mg | Freq: Two times a day (BID) | INTRAMUSCULAR | Status: DC
  Administered 2018-06-12 – 2018-06-13 (×3): 60 mg via INTRAVENOUS
  Filled 2018-06-12 (×3): qty 6

## 2018-06-12 NOTE — Progress Notes (Signed)
Pharmacy Antibiotic Note  Sara Schmidt is a 85 y.o. female admitted on 06/06/2018 with pneumonia.  Pharmacy has been consulted for Cefepime dosing.   ID: Sepsis/ PNA on flagyl+levaquin at SNF > cefepime 2/13. Afebrile, WBC wnl. Scr 1.1  Antimicrobials this admission: levaquin 2/12>2/13 Flagyl 2/12>2/13 Cefepime 2/13>>  Microbiology results: 2/11 BCx: ngf 2/12 UCx: recollect 2/14 MRSA neg 2/15 pleural cx: NGTD   Plan: Cefepime 2g IV every 24 hours for 7 day  Height: 5\' 5"  (165.1 cm) Weight: 223 lb 15.8 oz (101.6 kg) IBW/kg (Calculated) : 57  Temp (24hrs), Avg:98.3 F (36.8 C), Min:97.9 F (36.6 C), Max:98.5 F (36.9 C)  Recent Labs  Lab 06/06/18 1952 06/06/18 2255 06/07/18 0916 06/08/18 0357 06/09/18 0351 06/10/18 0532 06/11/18 0445  WBC 6.0  --  7.5 5.1 6.3 5.2 4.6  CREATININE 1.19*  --  1.09* 1.48* 1.55* 1.33* 1.13*  LATICACIDVEN 2.1* 1.5  --   --   --   --   --     Estimated Creatinine Clearance: 43 mL/min (A) (by C-G formula based on SCr of 1.13 mg/dL (H)).    Allergies  Allergen Reactions  . Ampicillin Shortness Of Breath    Reaction:  Unknown  Has patient had a PCN reaction causing immediate rash, facial/tongue/throat swelling, SOB or lightheadedness with hypotension:  Unsure Has patient had a PCN reaction causing severe rash involving mucus membranes or skin necrosis: Unsure Has patient had a PCN reaction that required hospitalization Unsure Has patient had a PCN reaction occurring within the last 10 years: Unsure If all of the above answers are "NO", then may proceed with Cephalosporin use.     Sara Schmidt S. Merilynn Finland, PharmD, BCPS Clinical Staff Pharmacist Sara Schmidt 06/12/2018 1:43 PM

## 2018-06-12 NOTE — Progress Notes (Signed)
Patient refused the use of the BIPAP

## 2018-06-12 NOTE — Clinical Social Work Note (Signed)
Clinical Social Work Assessment  Patient Details  Name: Sara Schmidt MRN: [id number] Date of Birth: Oct 24, 1930  Date of referral:  06/12/18               Reason for consult:  Facility Placement                Permission sought to share information with:  Facility Medical sales representative, Family Supports Permission granted to share information::  No  Name::     JC  Agency::  [facility]  Relationship::  Brother  Contact Information:  [phone]  Housing/Transportation Living arrangements for the past 2 months:  Skilled Nursing Facility Source of Information:  Siblings Patient Interpreter Needed:  None Criminal Activity/Legal Involvement Pertinent to Current Situation/Hospitalization:  No - Comment as needed Significant Relationships:  Siblings Lives with:  Facility Resident Do you feel safe going back to the place where you live?  Yes Need for family participation in patient care:  Yes (Comment)  Care giving concerns:  CSW received consult for possible SNF placement at time of discharge. CSW spoke with patient's brother and decision maker, JC, regarding discharge plan. He reported that patient has been at [facility] and he would like her to return there with hospice services. CSW to continue to follow and assist with discharge planning needs.   Social Worker assessment / plan:  CSW spoke with patient's brother concerning return to [facility].   Employment status:  Retired Database administrator PT Recommendations:  Skilled Nursing Facility Information / Referral to community resources:  Skilled Nursing Facility  Patient/Family's Response to care:  Patient's brother reports agreement with discharge plan.   Patient/Family's Understanding of and Emotional Response to Diagnosis, Current Treatment, and Prognosis:  Patient/family is realistic regarding therapy needs and expressed being hopeful for SNF placement. Patient's brother expressed understanding of CSW role  and discharge process as well as medical condition. No questions/concerns about plan or treatment.    Emotional Assessment Appearance:  Appears stated age Attitude/Demeanor/Rapport:  Unable to Assess Affect (typically observed):  Unable to Assess Orientation:  Oriented to Self, Oriented to Place Alcohol / Substance use:  Not Applicable Psych involvement (Current and /or in the community):  No (Comment)  Discharge Needs  Concerns to be addressed:  Care Coordination Readmission within the last 30 days:  No Current discharge risk:  Dependent with Mobility, Cognitively Impaired Barriers to Discharge:  Continued Medical Work up   [employer], LCSW 06/12/2018, 1:20 PM

## 2018-06-12 NOTE — [place] FL2 (Signed)
[place] MEDICAID FL2 LEVEL OF CARE SCREENING TOOL     IDENTIFICATION  Patient Name: Sara Schmidt Birthdate: Feb 22, 1931 Sex: female Admission Date (Current Location): 06/06/2018  [facility] and [place] Number:  Producer, television/film/video and Address:  [facility] [place], [address] [place], [place] [address]      Provider Number: [id number]  Attending Physician Name and Address:  Glade Lloyd, MD  Relative Name and Phone Number:       Current Level of Care: Hospital Recommended Level of Care: Skilled Nursing Facility Prior Approval Number:    Date Approved/Denied:   PASRR Number:    Discharge Plan: SNF    Current Diagnoses: Patient Active Problem List   Diagnosis Date Noted  . Pressure injury of skin 06/08/2018  . Pleural effusion   . Palliative care encounter   . Acute on chronic respiratory failure (HCC) 06/07/2018  . Neck pain 06/07/2018  . AMS (altered mental status) 06/07/2018  . AKI (acute kidney injury) (HCC) 06/07/2018  . Acute respiratory failure with hypoxia (HCC) 06/07/2018  . COPD exacerbation (HCC)   . Dyspnea   . Hypoxia   . HCAP (healthcare-associated pneumonia) 11/17/2015  . Essential hypertension 11/17/2015  . Diabetes mellitus type 2, controlled (HCC) 11/17/2015  . OSA on CPAP 11/17/2015  . Pneumonia 11/17/2015    Orientation RESPIRATION BLADDER Height & Weight     Self  O2(Hi flo 8L) Incontinent, External catheter Weight: 101.6 kg Height:  5\' 5"  (165.1 cm)  BEHAVIORAL SYMPTOMS/MOOD NEUROLOGICAL BOWEL NUTRITION STATUS      Continent Diet(Please see DC Summary)  AMBULATORY STATUS COMMUNICATION OF NEEDS Skin   Extensive Assist Verbally PU Stage and Appropriate Care(Stage II on coccyx)                       Personal Care Assistance Level of Assistance  Bathing, Feeding, Dressing Bathing Assistance: Maximum assistance Feeding assistance: Limited assistance Dressing Assistance: Limited assistance     Functional  Limitations Info  Sight, Hearing, Speech Sight Info: Adequate Hearing Info: Adequate Speech Info: Adequate    SPECIAL CARE FACTORS FREQUENCY                       Contractures Contractures Info: Not present    Additional Factors Info  Code Status, Allergies, Psychotropic, Insulin Sliding Scale Code Status Info: DNR Allergies Info: Ampicillin Psychotropic Info: Cymbalta, Seroquel, Lyrica Insulin Sliding Scale Info: 3x daily with meals       Current Medications (06/12/2018):  This is the current hospital active medication list Current Facility-Administered Medications  Medication Dose Route Frequency Provider Last Rate Last Dose  . 0.9 %  sodium chloride infusion   Intravenous Continuous John Giovanni, MD 10 mL/hr at 06/11/18 0929    . acetaminophen (TYLENOL) tablet 650 mg  650 mg Oral Q12H John Giovanni, MD   650 mg at 06/12/18 0859  . aspirin EC tablet 81 mg  81 mg Oral Daily John Giovanni, MD   81 mg at 06/12/18 0859  . atorvastatin (LIPITOR) tablet 10 mg  10 mg Oral QHS John Giovanni, MD   10 mg at 06/11/18 2151  . calcium carbonate (TUMS - dosed in mg elemental calcium) chewable tablet 400 mg of elemental calcium  2 tablet Oral QID PRN John Giovanni, MD      . ceFEPIme (MAXIPIME) 2 g in sodium chloride 0.9 % 100 mL IVPB  2 g Intravenous Q24H Glade Lloyd, MD  200 mL/hr at 06/12/18 0900 2 g at 06/12/18 0900  . cholecalciferol (VITAMIN D3) tablet 2,000 Units  2,000 Units Oral Daily John Giovanni, MD   2,000 Units at 06/12/18 0859  . ferrous sulfate tablet 325 mg  325 mg Oral Q breakfast John Giovanni, MD   325 mg at 06/12/18 0900  . furosemide (LASIX) injection 60 mg  60 mg Intravenous BID Glade Lloyd, MD   60 mg at 06/12/18 1306  . heparin injection 5,000 Units  5,000 Units Subcutaneous Q8H John Giovanni, MD   5,000 Units at 06/12/18 0542  . insulin aspart (novoLOG) injection 0-15 Units  0-15 Units Subcutaneous TID WC John Giovanni, MD   2 Units at 06/12/18 1305  . insulin glargine (LANTUS) injection 15 Units  15 Units Subcutaneous QHS Glade Lloyd, MD   15 Units at 06/11/18 2151  . insulin glargine (LANTUS) injection 15 Units  15 Units Subcutaneous Daily Glade Lloyd, MD   15 Units at 06/12/18 0855  . ipratropium-albuterol (DUONEB) 0.5-2.5 (3) MG/3ML nebulizer solution 3 mL  3 mL Nebulization Q6H PRN John Giovanni, MD      . magnesium gluconate (MAGONATE) tablet 500 mg  500 mg Oral TID John Giovanni, MD   500 mg at 06/12/18 0900  . MEDLINE mouth rinse  15 mL Mouth Rinse BID Hanley Ben, Kshitiz, MD   15 mL at 06/12/18 0900  . pantoprazole (PROTONIX) EC tablet 40 mg  40 mg Oral Daily John Giovanni, MD   40 mg at 06/12/18 0900  . polyvinyl alcohol (LIQUIFILM TEARS) 1.4 % ophthalmic solution 1 drop  1 drop Both Eyes QID John Giovanni, MD   1 drop at 06/12/18 1309     Discharge Medications: Please see discharge summary for a list of discharge medications.  Relevant Imaging Results:  Relevant Lab Results:   Additional Information SSN: [id number]   Add hospice services at [facility], LCSW

## 2018-06-12 NOTE — Progress Notes (Addendum)
Patient ID: Sara Schmidt Cad[place], female   DOB: 03-May-1930, 85 y.o.   MRN: [id number]  PROGRESS NOTE    Sara Schmidt Andreoli  [id number] DOB: 03-May-1930 DOA: 06/06/2018 PCP: Renford DillsPolite, Ronald, MD   Brief Narrative:  85 year old female with history of depression, diabetes, diastolic congestive heart failure, obesity hypoventilation syndrome, OSA, prior CVA with residual left hemiparesis presented from nursing home for evaluation of shortness of breath.  She was on Levaquin and Flagyl in the nursing home for treatment of pulmonary infection.  She was admitted with acute on chronic hypercarbic respiratory failure and initially was on nonrebreather.  Antibiotics were continued.  Also received intravenous Lasix.  Assessment & Plan:   Principal Problem:   Acute on chronic respiratory failure (HCC) Active Problems:   Diabetes mellitus type 2, controlled (HCC)   Neck pain   AMS (altered mental status)   AKI (acute kidney injury) (HCC)   Acute respiratory failure with hypoxia (HCC)   Pressure injury of skin   Pleural effusion   Palliative care encounter   Acute on chronic hypercarbic respiratory failure with respiratory acidosis secondary to pneumonia/acute on chronic diastolic congestive heart failure  -Patient was on nonrebreather on presentation.  Currently still on 8 L nasal cannula.   Probable healthcare associated pneumonia with bilateral pleural effusion -Currently on cefepime.  Finish 7-day course of antibiotics.  Cultures have been negative so far. -Right-sided ultrasound-guided thoracentesis with removal of 380 cc fluid on 06/10/2018.  Trace to small effusion on left: Insufficient for thoracentesis  Sepsis -Probably secondary to above.  Antibiotic plan as above.  Blood cultures negative so far  Hypotension -Improved. -sedative medications held altogether including nighttime Seroquel. -Overall prognosis is guarded to poor.  Palliative care evaluation appreciated.  As per their discussion with family  members, family wants to continue current treatment with antibiotics including thoracentesis and on discharge patient will go to SNF with hospice.  Acute on chronic diastolic congestive heart failure -Strict input and output.  Daily weight.  Echo showed EF of 60 to 65%. -Blood pressure stable.  Will put her on Lasix 60 mg IV every 12 hours for now.   Altered mental status -Unclear what her baseline mental status is.  She probably has baseline dementia.   -CT of the brain was negative for any new intracranial abnormality; has chronic old infarct with encephalomalacia -Has waxing and waning symptoms.  Probably back at her baseline now.  Monitor.  Will hold off on MRI.  acute kidney injury -monitor renal function.    Diabetes mellitus type 2 with hypoglycemia -Continue Lantus .continue CBGs with sliding scale insulin  Hyperlipidemia -continue statin  Depression - duloxetine and Seroquel will be kept on hold  Neuropathy -Hold gabapentin and Lyrica because of mental status  GERD -continue PPI  Stage II pressure injury on coccyx present on admission -Monitor.  Frequent position change.   DVT prophylaxis: Subcutaneous heparin Code Status: DNR Family Communication: None at bedside Disposition Plan: Discharge back to SNF in 1 to 3 days if clinically improved  Consultants: None  Procedures:  Echo on 06/07/2018 IMPRESSIONS    1. The left ventricle has normal systolic function with an ejection fraction of 60-65%. The cavity size was normal. There is moderately increased left ventricular wall thickness. Left ventricular diastolic Doppler parameters are consistent with  pseudonormalization secondary to atrial fibrillation.  2. The right ventricle has normal systolic function. The cavity was mildly enlarged. There is no increase in right ventricular wall thickness. Right ventricular systolic pressure is  moderately elevated with an estimated pressure of 35.2 mmHg.  3. The mitral  valve is normal in structure. There is mild thickening and mild calcification. There is mild to moderate mitral annular calcification present.  4. The tricuspid valve is normal in structure.  5. The aortic valve is normal in structure. There is mild thickening and mild calcification of the aortic valve. Aortic valve regurgitation is trivial by color flow Doppler.  6. The LCC is heavily calcified and is immobile.  7. Right atrial pressure is estimated at 8 mmHg.  8. The interatrial septum was not assessed.   Antimicrobials:  Anti-infectives (From admission, onward)   Start     Dose/Rate Route Frequency Ordered Stop   06/10/18 0800  ceFEPIme (MAXIPIME) 2 g in sodium chloride 0.9 % 100 mL IVPB     2 g 200 mL/hr over 30 Minutes Intravenous Every 24 hours 06/09/18 1441     06/08/18 1200  levofloxacin (LEVAQUIN) IVPB 500 mg  Status:  Discontinued     500 mg 66.7 mL/hr over 90 Minutes Intravenous Every 24 hours 06/07/18 1847 06/08/18 1145   06/08/18 1200  ceFEPIme (MAXIPIME) 2 g in sodium chloride 0.9 % 100 mL IVPB  Status:  Discontinued     2 g 200 mL/hr over 30 Minutes Intravenous Every 12 hours 06/08/18 1158 06/09/18 1441   06/07/18 1200  levofloxacin (LEVAQUIN) IVPB 750 mg  Status:  Discontinued     750 mg 100 mL/hr over 90 Minutes Intravenous Every 24 hours 06/07/18 0613 06/07/18 1847   06/07/18 0630  metronidazole (FLAGYL) IVPB 500 mg  Status:  Discontinued     500 mg 100 mL/hr over 60 Minutes Intravenous Every 8 hours 06/07/18 1157 06/08/18 1145        Subjective: Patient seen and examined at bedside.  He is sleepy, wakes up very little, hardly answers any questions.  No overnight fever, or vomiting reported   objective: Vitals:   06/11/18 2139 06/12/18 0507 06/12/18 0507 06/12/18 0750  BP: 115/73 (!) 176/98    Pulse: 93 (!) 104  97  Resp: 19   18  Temp: 98.5 F (36.9 C)  98.4 F (36.9 C)   TempSrc: Oral     SpO2: 100% 100%  96%  Weight:  101.6 kg    Height:         Intake/Output Summary (Last 24 hours) at 06/12/2018 0918 Last data filed at 06/12/2018 2620 Gross per 24 hour  Intake 707.33 ml  Output 1800 ml  Net -1092.67 ml   Filed Weights   06/10/18 0500 06/11/18 0500 06/12/18 0507  Weight: 104.7 kg 101.2 kg 101.6 kg    Examination:  General exam: Elderly female, lying in bed.  No distress.  Sleepy, wakes up only very slightly, hardly answers any questions.   Respiratory system: Bilateral decreased breath sounds at bases with bibasilar crackles  cardiovascular system: S1-S2 heard, intermittent tachycardia Gastrointestinal system: Abdomen is nondistended, soft and nontender. Normal bowel sounds heard. Extremities: No cyanosis; lower extremity edema   Data Reviewed: I have personally reviewed following labs and imaging studies  CBC: Recent Labs  Lab 06/07/18 0916 06/08/18 0357 06/09/18 0351 06/10/18 0532 06/11/18 0445  WBC 7.5 5.1 6.3 5.2 4.6  NEUTROABS 5.0 3.3 3.7 2.4 2.6  HGB 11.3* 10.3* 10.3* 10.3* 11.6*  HCT 37.8 37.2 35.5* 34.3* 39.6  MCV 92.2 95.4 91.7 89.1 90.0  PLT 146* 160 166 167 172   Basic Metabolic Panel: Recent Labs  Lab 06/07/18 0916  06/08/18 0357 06/09/18 0351 06/10/18 0532 06/11/18 0445  NA 145 143 144 143 145  K 4.5 4.3 4.0 3.6 4.0  CL 96* 100 102 101 101  CO2 35* 37* 34* 32 38*  GLUCOSE 126* 152* 85 52* 158*  BUN 30* 30* 33* 26* 19  CREATININE 1.09* 1.48* 1.55* 1.33* 1.13*  CALCIUM 8.7* 8.1* 8.0* 8.3* 8.7*  MG 2.6* 2.6* 2.7* 2.7* 2.4   GFR: Estimated Creatinine Clearance: 43 mL/min (A) (by C-G formula based on SCr of 1.13 mg/dL (H)). Liver Function Tests: Recent Labs  Lab 06/06/18 1952 06/07/18 0916  AST 17 16  ALT 15 14  ALKPHOS 28* 26*  BILITOT 0.9 0.8  PROT 5.9* 6.2*  ALBUMIN 3.2* 3.3*   No results for input(s): LIPASE, AMYLASE in the last 168 hours. No results for input(s): AMMONIA in the last 168 hours. Coagulation Profile: No results for input(s): INR, PROTIME in the last 168  hours. Cardiac Enzymes: No results for input(s): CKTOTAL, CKMB, CKMBINDEX, TROPONINI in the last 168 hours. BNP (last 3 results) No results for input(s): PROBNP in the last 8760 hours. HbA1C: No results for input(s): HGBA1C in the last 72 hours. CBG: Recent Labs  Lab 06/11/18 0809 06/11/18 1207 06/11/18 1643 06/11/18 2135 06/12/18 0818  GLUCAP 110* 205* 173* 169* 126*   Lipid Profile: No results for input(s): CHOL, HDL, LDLCALC, TRIG, CHOLHDL, LDLDIRECT in the last 72 hours. Thyroid Function Tests: No results for input(s): TSH, T4TOTAL, FREET4, T3FREE, THYROIDAB in the last 72 hours. Anemia Panel: No results for input(s): VITAMINB12, FOLATE, FERRITIN, TIBC, IRON, RETICCTPCT in the last 72 hours. Sepsis Labs: Recent Labs  Lab 06/06/18 1952 06/06/18 2255 06/07/18 0644  PROCALCITON  --   --  <0.10  LATICACIDVEN 2.1* 1.5  --     Recent Results (from the past 240 hour(s))  Culture, blood (Routine X 2) w Reflex to ID Panel     Status: None   Collection Time: 06/06/18  8:15 PM  Result Value Ref Range Status   Specimen Description BLOOD LEFT HAND  Final   Special Requests   Final    BOTTLES DRAWN AEROBIC AND ANAEROBIC Blood Culture adequate volume   Culture   Final    NO GROWTH 5 DAYS Performed at [facility], [address] [place], [place] [address]    Report Status 06/11/2018 FINAL  Final  Culture, blood (Routine X 2) w Reflex to ID Panel     Status: None   Collection Time: 06/06/18  8:20 PM  Result Value Ref Range Status   Specimen Description BLOOD SITE NOT SPECIFIED  Final   Special Requests   Final    BOTTLES DRAWN AEROBIC AND ANAEROBIC Blood Culture adequate volume   Culture   Final    NO GROWTH 5 DAYS Performed at [facility], [address], [place], [place] [address]    Report Status 06/11/2018 FINAL  Final  Urine Culture     Status: Abnormal   Collection Time: 06/07/18  3:27 PM  Result Value Ref Range Status   Specimen Description URINE,  CLEAN CATCH  Final   Special Requests   Final    NONE Performed at [facility], [address] [place], [place] [address]    Culture MULTIPLE SPECIES PRESENT, SUGGEST RECOLLECTION (A)  Final   Report Status 06/08/2018 FINAL  Final  MRSA PCR Screening     Status: None   Collection Time: 06/09/18  1:52 AM  Result Value Ref Range Status   MRSA by PCR NEGATIVE NEGATIVE Final    Comment:        The GeneXpert MRSA Assay (FDA approved for NASAL specimens only), is one component of a comprehensive MRSA colonization surveillance program. It is not intended to diagnose MRSA infection nor to guide or monitor treatment for MRSA infections. Performed at [facility], [address] [place], [place] [address]   Culture, body fluid-bottle     Status: None (Preliminary result)   Collection Time: 06/10/18  2:18 PM  Result Value Ref Range Status   Specimen Description PLEURAL RIGHT  Final   Special Requests NONE  Final   Culture   Final    NO GROWTH < 24 HOURS Performed at [facility], [address] [place], [place] [address]    Report Status PENDING  Incomplete  Gram stain     Status: None   Collection Time: 06/10/18  2:18 PM  Result Value Ref Range Status   Specimen Description PLEURAL RIGHT  Final   Special Requests NONE  Final   Gram Stain   Final    ABUNDANT WBC PRESENT, PREDOMINANTLY MONONUCLEAR NO ORGANISMS SEEN Performed at [facility], [address] [place], [place] [address]    Report Status 06/10/2018 FINAL  Final         Radiology Studies: Dg Chest 1 View  Result Date: 06/10/2018 CLINICAL DATA:  Post right-sided thoracentesis EXAM: CHEST  1 VIEW COMPARISON:  06/09/2018; 06/08/2018 FINDINGS: Interval reduction/resolution of right-sided pleural effusion post thoracentesis. No pneumothorax. Improved aeration the right lung base. Grossly unchanged enlarged cardiac silhouette and mediastinal contours with atherosclerotic plaque when the  thoracic aorta. Unchanged small left-sided effusion with associated left basilar heterogeneous/consolidative opacities. Pulmonary vasculature remains indistinct with cephalization of flow. No acute osseous abnormalities. IMPRESSION: 1. Interval reduction/resolution of right-sided effusion post thoracentesis. No pneumothorax. 2. Improved aeration of the right lung base. 3. Similar findings of cardiomegaly, pulmonary edema, small left-sided effusion associated left basilar opacities, likely atelectasis. Electronically Signed   By: Simonne ComeJohn  Watts M.D.   On: 06/10/2018 14:37   [place] Thoracentesis Asp Pleural Space W/img Guide  Result Date: 06/10/2018 INDICATION: Shortness of breath. Bilateral pleural effusions by recent imaging. Request diagnostic and therapeutic thoracentesis. EXAM: ULTRASOUND GUIDED RIGHT THORACENTESIS MEDICATIONS: None. COMPLICATIONS: None immediate. PROCEDURE: An ultrasound guided thoracentesis was thoroughly discussed with the patient and questions answered. The benefits, risks, alternatives and complications were also discussed. The patient understands and wishes to proceed with the procedure. Written consent was obtained. Ultrasound of the left chest demonstrates trace to small pleural effusion, insufficient for safe thoracentesis. Ultrasound of the right chest demonstrates small pleural effusion, window amenable for safe thoracentesis. Ultrasound was performed to localize and mark an adequate pocket of fluid in the right chest. The area was then prepped and draped in the normal sterile fashion. 1% Lidocaine was used for local anesthesia. Under ultrasound guidance a 6 Fr Safe-T-Centesis catheter was introduced. Thoracentesis was performed. The catheter was removed and a dressing applied. FINDINGS: A total of approximately 380 mL of thin, serosanguineous fluid was removed. Samples were sent to the laboratory as requested by the clinical team. IMPRESSION: Successful ultrasound guided right  thoracentesis yielding 380 mL of pleural fluid. Read by: Brayton ElKevin Bruning PA-C Electronically Signed   By: Simonne ComeJohn  Watts M.D.   On: 06/10/2018 14:26        Scheduled Meds: . acetaminophen  650 mg Oral Q12H  .  aspirin EC  81 mg Oral Daily  . atorvastatin  10 mg Oral QHS  . cholecalciferol  2,000 Units Oral Daily  . ferrous sulfate  325 mg Oral Q breakfast  . furosemide  60 mg Intravenous Q12H  . heparin  5,000 Units Subcutaneous Q8H  . insulin aspart  0-15 Units Subcutaneous TID WC  . insulin glargine  15 Units Subcutaneous QHS  . insulin glargine  15 Units Subcutaneous Daily  . magnesium gluconate  500 mg Oral TID  . mouth rinse  15 mL Mouth Rinse BID  . pantoprazole  40 mg Oral Daily  . polyvinyl alcohol  1 drop Both Eyes QID   Continuous Infusions: . sodium chloride 10 mL/hr at 06/11/18 0929  . ceFEPime (MAXIPIME) IV 2 g (06/12/18 0900)     LOS: 5 days        Glade Lloyd, MD [facility] Pager [phone]  If 7PM-7AM, please contact night-coverage [email or web address] Password [facility] 06/12/2018, 9:18 AM

## 2018-06-12 NOTE — Progress Notes (Signed)
Patient weened down to 6L HFNC Pulseox at 93%. No SOB, Regular unlabored breathing.

## 2018-06-13 ENCOUNTER — Inpatient Hospital Stay (HOSPITAL_COMMUNITY): Payer: Medicare Other

## 2018-06-13 LAB — BASIC METABOLIC PANEL
Anion gap: 12 (ref 5–15)
BUN: 13 mg/dL (ref 8–23)
CO2: 38 mmol/L — ABNORMAL HIGH (ref 22–32)
Calcium: 9.6 mg/dL (ref 8.9–10.3)
Chloride: 94 mmol/L — ABNORMAL LOW (ref 98–111)
Creatinine, Ser: 0.94 mg/dL (ref 0.44–1.00)
GFR calc Af Amer: >60 mL/min (ref >60)
GFR calc non Af Amer: 55 mL/min — ABNORMAL LOW (ref >60)
GLUCOSE: 159 mg/dL — AB (ref 70–99)
Potassium: 3.4 mmol/L — ABNORMAL LOW (ref 3.5–5.1)
Sodium: 144 mmol/L (ref 135–145)

## 2018-06-13 LAB — CBC WITH DIFFERENTIAL/PLATELET
Abs Immature Granulocytes: 0.03 10*3/uL (ref 0.00–0.07)
Basophils Absolute: 0 10*3/uL (ref 0.0–0.1)
Basophils Relative: 1 %
Eosinophils Absolute: 0.2 10*3/uL (ref 0.0–0.5)
Eosinophils Relative: 3 %
HCT: 39.7 % (ref 36.0–46.0)
Hemoglobin: 12.3 g/dL (ref 12.0–15.0)
Immature Granulocytes: 1 %
Lymphocytes Relative: 28 %
Lymphs Abs: 1.5 10*3/uL (ref 0.7–4.0)
MCH: 27.3 pg (ref 26.0–34.0)
MCHC: 31 g/dL (ref 30.0–36.0)
MCV: 88 fL (ref 80.0–100.0)
MONO ABS: 0.9 10*3/uL (ref 0.1–1.0)
Monocytes Relative: 17 %
NRBC: 0 % (ref 0.0–0.2)
Neutro Abs: 2.6 10*3/uL (ref 1.7–7.7)
Neutrophils Relative %: 50 %
Platelets: 200 10*3/uL (ref 150–400)
RBC: 4.51 MIL/uL (ref 3.87–5.11)
RDW: 15.8 % — ABNORMAL HIGH (ref 11.5–15.5)
WBC: 5.2 10*3/uL (ref 4.0–10.5)

## 2018-06-13 LAB — GLUCOSE, CAPILLARY
Glucose-Capillary: 139 mg/dL — ABNORMAL HIGH (ref 70–99)
Glucose-Capillary: 160 mg/dL — ABNORMAL HIGH (ref 70–99)

## 2018-06-13 LAB — MAGNESIUM: Magnesium: 1.9 mg/dL (ref 1.7–2.4)

## 2018-06-13 MED ORDER — SODIUM CHLORIDE 0.9% FLUSH: 10.0000 mL | INTRAVENOUS | Status: DC | PRN

## 2018-06-13 MED ORDER — FUROSEMIDE 20 MG PO TABS: 40.0000 mg | ORAL_TABLET | Freq: Two times a day (BID) | ORAL | Status: AC

## 2018-06-13 MED ORDER — POTASSIUM CHLORIDE CRYS ER 20 MEQ PO TBCR
40.0000 meq | EXTENDED_RELEASE_TABLET | Freq: Once | ORAL | Status: AC
  Administered 2018-06-13: 40 meq via ORAL
  Filled 2018-06-13: qty 2

## 2018-06-13 MED ORDER — INSULIN GLARGINE 100 UNIT/ML ~~LOC~~ SOLN: 15.0000 [IU] | Freq: Two times a day (BID) | SUBCUTANEOUS | Status: AC

## 2018-06-13 NOTE — Progress Notes (Addendum)
Patient will DC to: [facility] Anticipated DC date: 06/13/2018 Family notified: Brother Transport by: Sharin Mons ~12:30pm   Per MD patient ready for DC to [facility] with hospice. RN, patient, patient's family, and facility notified of DC. Discharge Summary and FL2 sent to facility. RN to call report prior to discharge [phone]). DC packet on chart. Ambulance transport requested for patient.   CSW will sign off for now as social work intervention is no longer needed. Please consult [place] again if new needs arise.  Cristobal Goldmann, LCSW Clinical Social Worker [phone]

## 2018-06-13 NOTE — Care Management Important Message (Signed)
Important Message  Patient Details  Name: Sara Schmidt MRN: [id number] Date of Birth: 10/17/1930   Medicare Important Message Given:  Yes    Rakayla Ricklefs P Maxey Ransom 06/13/2018, 1:08 PM

## 2018-06-13 NOTE — Discharge Summary (Signed)
Physician Discharge Summary  Sara Schmidt DGU:[id number] DOB: November 01, 1930 DOA: 06/06/2018  PCP: Renford DillsPolite, Ronald, MD  Admit date: 06/06/2018 Discharge date: 06/13/2018  Admitted From: SNF Disposition: SNF  Recommendations for Outpatient Follow-up:  1. Follow up with SNF provider at earliest convenience 2. Follow-up with hospice at SNF   Home Health: No Equipment/Devices: Oxygen via nasal cannula  Discharge Condition: Poor CODE STATUS: DNR Diet recommendation: Heart Healthy / Carb Modified /diet as per SLP recommendations  Brief/Interim Summary: 85 year old female with history of depression, diabetes, diastolic congestive heart failure, obesity hypoventilation syndrome, OSA, prior CVA with residual left hemiparesis presented from nursing home for evaluation of shortness of breath.  She was on Levaquin and Flagyl in the nursing home for treatment of pulmonary infection.  She was admitted with acute on chronic hypercarbic respiratory failure and initially was on nonrebreather.  Antibiotics were continued.  Also received intravenous Lasix.  During the hospitalization, she underwent right sided thoracentesis with removal of 380 cc fluid.  Lasix could not be given initially but subsequently her blood pressure improved and she received intravenous Lasix.  Today is day #7 of antibiotics.  Palliative care had discussion with family members and family members decided that she would return back to SNF with hospice.  Overall prognosis is poor.  Discharge to SNF once bed is available.   Discharge Diagnoses:  Principal Problem:   Acute on chronic respiratory failure (HCC) Active Problems:   Diabetes mellitus type 2, controlled (HCC)   Neck pain   AMS (altered mental status)   AKI (acute kidney injury) (HCC)   Acute respiratory failure with hypoxia (HCC)   Pressure injury of skin   Pleural effusion   Palliative care encounter  Acute on chronic hypercarbic respiratory failure with respiratory acidosis  secondary to pneumonia/acute on chronic diastolic congestive heart failure  -Patient was on nonrebreather on presentation.  Currently still on 7 L nasal cannula.   Probable healthcare associated pneumonia with bilateral pleural effusion -Currently on cefepime.    Today's day 7 of antibiotics.  Discontinue antibiotics afterwards.  Cultures have been negative so far. -Right-sided ultrasound-guided thoracentesis with removal of 380 cc fluid on 06/10/2018.  Trace to small effusion on left: Insufficient for thoracentesis  Sepsis -Probably secondary to above.  Antibiotic plan as above.  Blood cultures negative so far -Sepsis has resolved  Hypotension -Improved. -sedative medications held altogether including nighttime Seroquel. -Overall prognosis is guarded to poor.  Palliative care evaluation appreciated.  As per their discussion with family members, family wants to continue current treatment with antibiotics including thoracentesis and on discharge patient will go to SNF with hospice. -Blood pressure is improved but will keep holding off on other antihypertensives except Lasix for now.  Acute on chronic diastolic congestive heart failure -Strict input and output.  Daily weight.  Echo showed EF of 60 to 65%. -Initially patient could not get Lasix because of hypotension.  After blood pressure improved, she was put on intravenous Lasix.  She is making urine output.  Will discharge on Lasix 40 mg twice a day.  Other medications including metoprolol, lisinopril can be resumed the nursing home depending on the blood pressure.  Altered mental status -Unclear what her baseline mental status is.  She probably has baseline dementia.   -CT of the brain was negative for any new intracranial abnormality; has chronic old infarct with encephalomalacia -Has waxing and waning symptoms.  Probably back at her baseline now.  -All her sedative medications will remain on hold upon  discharge as well.  acute  kidney injury -Resolved  Diabetes mellitus type 2 with hypoglycemia -Continue Lantus .continue CBGs with sliding scale insulin  Hyperlipidemia -continue statin  Depression - duloxetine and Seroquel will be kept on hold  Neuropathy -Hold gabapentin and Lyrica because of mental status  GERD -continue PPI  Stage II pressure injury on coccyx present on admission -Monitor.  Frequent position change.  Discharge Instructions  Discharge Instructions    Diet - low sodium heart healthy   Complete by:  As directed    Diet Carb Modified   Complete by:  As directed    Increase activity slowly   Complete by:  As directed      Allergies as of 06/13/2018      Reactions   Ampicillin Shortness Of Breath   Reaction:  Unknown  Has patient had a PCN reaction causing immediate rash, facial/tongue/throat swelling, SOB or lightheadedness with hypotension:  Unsure Has patient had a PCN reaction causing severe rash involving mucus membranes or skin necrosis: Unsure Has patient had a PCN reaction that required hospitalization Unsure Has patient had a PCN reaction occurring within the last 10 years: Unsure If all of the above answers are "NO", then may proceed with Cephalosporin use.      Medication List    STOP taking these medications   amLODipine 10 MG tablet Commonly known as:  NORVASC   cloNIDine 0.1 MG tablet Commonly known as:  CATAPRES   DULoxetine 20 MG capsule Commonly known as:  CYMBALTA   gabapentin 300 MG capsule Commonly known as:  NEURONTIN   levofloxacin 500 MG tablet Commonly known as:  LEVAQUIN   levofloxacin 750 MG/150ML Soln Commonly known as:  LEVAQUIN   lisinopril 10 MG tablet Commonly known as:  PRINIVIL,ZESTRIL   metoprolol succinate 50 MG 24 hr tablet Commonly known as:  TOPROL-XL   metronidazole 500-0.74 MG/100ML-% Commonly known as:  FLAGYL   pregabalin 50 MG capsule Commonly known as:  LYRICA   QUEtiapine 25 MG tablet Commonly known  as:  SEROQUEL     TAKE these medications   acetaminophen 650 MG CR tablet Commonly known as:  TYLENOL Take 650 mg by mouth every 12 (twelve) hours.   aspirin EC 81 MG tablet Take 81 mg by mouth daily.   atorvastatin 10 MG tablet Commonly known as:  LIPITOR Take 10 mg by mouth at bedtime.   calcium carbonate 500 MG chewable tablet Commonly known as:  TUMS - dosed in mg elemental calcium Chew 2 tablets by mouth 4 (four) times daily as needed for indigestion or heartburn.   cholecalciferol 1000 units tablet Commonly known as:  VITAMIN D Take 2,000 Units by mouth daily.   ferrous sulfate 325 (65 FE) MG tablet Take 325 mg by mouth daily with breakfast.   furosemide 20 MG tablet Commonly known as:  LASIX Take 2 tablets (40 mg total) by mouth 2 (two) times daily. What changed:    how much to take  when to take this   insulin glargine 100 UNIT/ML injection Commonly known as:  LANTUS Inject 0.15 mLs (15 Units total) into the skin 2 (two) times daily. What changed:    how much to take  when to take this   ipratropium-albuterol 0.5-2.5 (3) MG/3ML Soln Commonly known as:  DUONEB Take 3 mLs by nebulization every 6 (six) hours as needed (for wheezing).   magnesium gluconate 500 MG tablet Commonly known as:  MAGONATE Take 500 mg by mouth 3 (three)  times daily.   metFORMIN 1000 MG tablet Commonly known as:  GLUCOPHAGE Take 1,000 mg by mouth 2 (two) times daily with a meal.   nitroGLYCERIN 0.4 MG SL tablet Commonly known as:  NITROSTAT Place 0.4 mg under the tongue every 5 (five) minutes as needed for chest pain.   omeprazole 20 MG capsule Commonly known as:  PRILOSEC Take 20 mg by mouth daily before breakfast.   PREVIDENT 5000 BOOSTER 1.1 % Pste Generic drug:  Sodium Fluoride Place onto teeth every evening.   REFRESH LIQUIGEL 1 % Gel Generic drug:  Carboxymethylcellulose Sodium Place 1 drop into both eyes 4 (four) times daily.   REFRESH OPTIVE ADVANCED  0.5-1-0.5 % Soln Generic drug:  Carboxymeth-Glycerin-Polysorb Place 1 drop into both eyes 4 (four) times daily.       Contact information for follow-up providers    hospice Follow up.   Why:  At earliest convenience           Contact information for after-discharge care    Destination    [facility] .   Service:  Skilled Nursing Contact information: [address] [place] [address] [phone]                 Allergies  Allergen Reactions  . Ampicillin Shortness Of Breath    Reaction:  Unknown  Has patient had a PCN reaction causing immediate rash, facial/tongue/throat swelling, SOB or lightheadedness with hypotension:  Unsure Has patient had a PCN reaction causing severe rash involving mucus membranes or skin necrosis: Unsure Has patient had a PCN reaction that required hospitalization Unsure Has patient had a PCN reaction occurring within the last 10 years: Unsure If all of the above answers are "NO", then may proceed with Cephalosporin use.    Consultations:  Palliative care   Procedures/Studies: Dg Chest 1 View  Result Date: 06/10/2018 CLINICAL DATA:  Post right-sided thoracentesis EXAM: CHEST  1 VIEW COMPARISON:  06/09/2018; 06/08/2018 FINDINGS: Interval reduction/resolution of right-sided pleural effusion post thoracentesis. No pneumothorax. Improved aeration the right lung base. Grossly unchanged enlarged cardiac silhouette and mediastinal contours with atherosclerotic plaque when the thoracic aorta. Unchanged small left-sided effusion with associated left basilar heterogeneous/consolidative opacities. Pulmonary vasculature remains indistinct with cephalization of flow. No acute osseous abnormalities. IMPRESSION: 1. Interval reduction/resolution of right-sided effusion post thoracentesis. No pneumothorax. 2. Improved aeration of the right lung base. 3. Similar findings of cardiomegaly, pulmonary edema, small left-sided effusion  associated left basilar opacities, likely atelectasis. Electronically Signed   By: Simonne Come M.D.   On: 06/10/2018 14:37   Dg Cervical Spine Complete  Result Date: 06/07/2018 CLINICAL DATA:  Neck pain EXAM: CERVICAL SPINE - COMPLETE 4+ VIEW COMPARISON:  MR angiogram of the neck dated 11/13/2003 FINDINGS: Suboptimal visualization of the lower cervical spine due to the patient's shoulders despite swimmer's view. Portions of cortex of C6, C7, and the upper margin of T1 remain somewhat obscured on the lateral projections. No prevertebral soft tissue swelling is identified. Multilevel spondylosis with anterior bridging spurring at C4-5 and C5-6 along with large anterior interbody spurs at C6-7 and C7-T1. No appreciable malalignment in the cervical spine Ill definition of the neural foramina on the oblique projections, foraminal impingement by uncinate and facet spurring is anticipated at multiple levels. No fracture is identified. Sclerosis in the left mandible, incompletely evaluated. IMPRESSION: 1. No cervical spine fracture or acute subluxation. Sensitivity for subtle abnormalities is reduced especially on the lateral projections due to obscuration of the  lower cervical spine. 2. Multilevel spondylosis likely resulting in multilevel foraminal impingement based on the oblique projections. 3. Incompletely evaluated sclerosis in the left mandible. 4. If comprehensive workup is warranted, dedicated CT or MRI would be recommended. Electronically Signed   By: Gaylyn Rong M.D.   On: 06/07/2018 08:18   Ct Head Wo Contrast  Result Date: 06/08/2018 CLINICAL DATA:  Altered mental status. EXAM: CT HEAD WITHOUT CONTRAST TECHNIQUE: Contiguous axial images were obtained from the base of the skull through the vertex without intravenous contrast. COMPARISON:  Brain MRI 11/13/2003 FINDINGS: Brain: No evidence of acute hemorrhage, hydrocephalus, or mass lesion/mass effect. An area of encephalomalacia within the vascular  territory of the right MCA appears larger than on the comparison MRI dated 11/13/2003. This may represent matured ischemic changes, however reinfarction in the periphery of this lesion is difficult to exclude. Deep white matter microangiopathy. Vascular: Calcific atherosclerotic disease of the intra cavernous carotid arteries and bilateral MCA. Skull: Normal. Negative for fracture or focal lesion. Sinuses/Orbits: No acute finding. Other: None. IMPRESSION: An area of encephalomalacia within the vascular territory of the right MCA appears larger than on the comparison MRI dated 11/13/2003. This may represent matured ischemic changes, however reinfarction in the periphery of this lesion is difficult to exclude. Deep white matter microangiopathy. No evidence of intracranial hemorrhage. Electronically Signed   By: Ted Mcalpine M.D.   On: 06/08/2018 14:43   Dg Chest Port 1 View  Result Date: 06/13/2018 CLINICAL DATA:  Dyspnea EXAM: PORTABLE CHEST 1 VIEW COMPARISON:  06/10/2018 FINDINGS: Chronic cardiomegaly. Hilar prominence likely related to vascular congestion. Probable small left pleural effusion. Streaky retrocardiac density. No generalized Kerley lines. No air bronchogram or pneumothorax. IMPRESSION: 1. Cardiomegaly and vascular congestion. 2. Probable atelectasis and small pleural effusion on the left. Electronically Signed   By: Marnee Spring M.D.   On: 06/13/2018 08:49   Dg Chest Port 1 View  Result Date: 06/09/2018 CLINICAL DATA:  Dyspnea EXAM: PORTABLE CHEST 1 VIEW COMPARISON:  Yesterday FINDINGS: Cardiomegaly and vascular congestion. Hazy opacity at the bases from pleural fluid and airspace disease. No pneumothorax. IMPRESSION: Unchanged cardiomegaly and bilateral pleural effusion with lower lobe opacity. Electronically Signed   By: Marnee Spring M.D.   On: 06/09/2018 09:23   Dg Chest Port 1 View  Result Date: 06/08/2018 CLINICAL DATA:  Shortness of breath EXAM: PORTABLE CHEST 1 VIEW  COMPARISON:  Chest radiograph 06/06/2018 FINDINGS: Patient is rotated. Cardiomegaly. Aortic atherosclerosis. Moderate layering bilateral pleural effusions with underlying consolidation. IMPRESSION: Cardiomegaly. Layering bilateral effusions with underlying consolidation. Electronically Signed   By: Annia Belt M.D.   On: 06/08/2018 10:41   Dg Chest Port 1 View  Result Date: 06/06/2018 CLINICAL DATA:  Dyspnea EXAM: PORTABLE CHEST 1 VIEW COMPARISON:  11/20/2015 FINDINGS: Confluent opacities in the mid to lower lungs are noted bilaterally with interstitial edema seen. Findings may reflect stigmata of pulmonary edema. Superimposed pneumonia would be difficult to entirely exclude as would layering bilateral effusions. Cardiomegaly is stable. Aortic atherosclerosis at the arch is noted without aneurysmal dilatation. No acute osseous abnormality is seen. IMPRESSION: Cardiomegaly with interstitial pulmonary edema. Superimposed pneumonia would be difficult to entirely exclude. Probable bilateral layering effusions. Electronically Signed   By: Tollie Eth M.D.   On: 06/06/2018 20:10   US Thoracentesis Asp Pleural Space W/img Guide  Result Date: 06/10/2018 INDICATION: Shortness of breath. Bilateral pleural effusions by recent imaging. Request diagnostic and therapeutic thoracentesis. EXAM: ULTRASOUND GUIDED RIGHT THORACENTESIS MEDICATIONS: None. COMPLICATIONS: None  immediate. PROCEDURE: An ultrasound guided thoracentesis was thoroughly discussed with the patient and questions answered. The benefits, risks, alternatives and complications were also discussed. The patient understands and wishes to proceed with the procedure. Written consent was obtained. Ultrasound of the left chest demonstrates trace to small pleural effusion, insufficient for safe thoracentesis. Ultrasound of the right chest demonstrates small pleural effusion, window amenable for safe thoracentesis. Ultrasound was performed to localize and mark an  adequate pocket of fluid in the right chest. The area was then prepped and draped in the normal sterile fashion. 1% Lidocaine was used for local anesthesia. Under ultrasound guidance a 6 Fr Safe-T-Centesis catheter was introduced. Thoracentesis was performed. The catheter was removed and a dressing applied. FINDINGS: A total of approximately 380 mL of thin, serosanguineous fluid was removed. Samples were sent to the laboratory as requested by the clinical team. IMPRESSION: Successful ultrasound guided right thoracentesis yielding 380 mL of pleural fluid. Read by: Brayton El PA-C Electronically Signed   By: Simonne Come M.D.   On: 06/10/2018 14:26    Echo Echo on 06/07/2018 IMPRESSIONS   1. The left ventricle has normal systolic function with an ejection fraction of 60-65%. The cavity size was normal. There is moderately increased left ventricular wall thickness. Left ventricular diastolic Doppler parameters are consistent with  pseudonormalization secondary to atrial fibrillation. 2. The right ventricle has normal systolic function. The cavity was mildly enlarged. There is no increase in right ventricular wall thickness. Right ventricular systolic pressure is moderately elevated with an estimated pressure of 35.2 mmHg. 3. The mitral valve is normal in structure. There is mild thickening and mild calcification. There is mild to moderate mitral annular calcification present. 4. The tricuspid valve is normal in structure. 5. The aortic valve is normal in structure. There is mild thickening and mild calcification of the aortic valve. Aortic valve regurgitation is trivial by color flow Doppler. 6. The LCC is heavily calcified and is immobile. 7. Right atrial pressure is estimated at 8 mmHg. 8. The interatrial septum was not assessed.   Subjective: Patient seen and examined at bedside.  She is awake and pleasantly confused.  No overnight fever, nausea or vomiting.  Discharge Exam: Vitals:    06/12/18 2127 06/13/18 0407  BP: 132/73 (!) 148/92  Pulse: (!) 56 79  Resp:  18  Temp: 98.5 F (36.9 C) 98.6 F (37 C)  SpO2: 96% 99%   Vitals:   06/12/18 1457 06/12/18 2127 06/13/18 0407 06/13/18 0442  BP: 133/67 132/73 (!) 148/92   Pulse: (!) 101 (!) 56 79   Resp: 16  18   Temp: 99 F (37.2 C) 98.5 F (36.9 C) 98.6 F (37 C)   TempSrc: Oral Oral Oral   SpO2: 100% 96% 99%   Weight:    96.8 kg  Height:        General: Pt is awake, pleasantly confused.  No acute distress  cardiovascular: rate controlled, S1/S2 + Respiratory: bilateral decreased breath sounds at bases, basilar crackles Abdominal: Soft, NT, ND, bowel sounds + Extremities: Trace edema, no cyanosis    The results of significant diagnostics from this hospitalization (including imaging, microbiology, ancillary and laboratory) are listed below for reference.     Microbiology: Recent Results (from the past 240 hour(s))  Culture, blood (Routine X 2) w Reflex to ID Panel     Status: None   Collection Time: 06/06/18  8:15 PM  Result Value Ref Range Status   Specimen Description BLOOD LEFT HAND  Final   Special Requests   Final    BOTTLES DRAWN AEROBIC AND ANAEROBIC Blood Culture adequate volume   Culture   Final    NO GROWTH 5 DAYS Performed at [facility], [address], [place], [place] [address]    Report Status 06/11/2018 FINAL  Final  Culture, blood (Routine X 2) w Reflex to ID Panel     Status: None   Collection Time: 06/06/18  8:20 PM  Result Value Ref Range Status   Specimen Description BLOOD SITE NOT SPECIFIED  Final   Special Requests   Final    BOTTLES DRAWN AEROBIC AND ANAEROBIC Blood Culture adequate volume   Culture   Final    NO GROWTH 5 DAYS Performed at [facility], [address] [place], [place] [address]    Report Status 06/11/2018 FINAL  Final  Urine Culture     Status: Abnormal   Collection Time: 06/07/18  3:27 PM  Result Value Ref Range Status   Specimen  Description URINE, CLEAN CATCH  Final   Special Requests   Final    NONE Performed at [facility], [address] [place], [place] [address]    Culture MULTIPLE SPECIES PRESENT, SUGGEST RECOLLECTION (A)  Final   Report Status 06/08/2018 FINAL  Final  MRSA PCR Screening     Status: None   Collection Time: 06/09/18  1:52 AM  Result Value Ref Range Status   MRSA by PCR NEGATIVE NEGATIVE Final    Comment:        The GeneXpert MRSA Assay (FDA approved for NASAL specimens only), is one component of a comprehensive MRSA colonization surveillance program. It is not intended to diagnose MRSA infection nor to guide or monitor treatment for MRSA infections. Performed at [facility], [address], [place], [place] [address]   Culture, body fluid-bottle     Status: None (Preliminary result)   Collection Time: 06/10/18  2:18 PM  Result Value Ref Range Status   Specimen Description PLEURAL RIGHT  Final   Special Requests NONE  Final   Culture   Final    NO GROWTH 2 DAYS Performed at [facility], [address], [place], [place] [address]    Report Status PENDING  Incomplete  Gram stain     Status: None   Collection Time: 06/10/18  2:18 PM  Result Value Ref Range Status   Specimen Description PLEURAL RIGHT  Final   Special Requests NONE  Final   Gram Stain   Final    ABUNDANT WBC PRESENT, PREDOMINANTLY MONONUCLEAR NO ORGANISMS SEEN Performed at [facility], [address] [place], [place] [address]    Report Status 06/10/2018 FINAL  Final     Labs: BNP (last 3 results) Recent Labs    06/06/18 2258  BNP 591.2*   Basic Metabolic Panel: Recent Labs  Lab 06/08/18 0357 06/09/18 0351 06/10/18 0532 06/11/18 0445 06/13/18 0422  NA 143 144 143 145 144  K 4.3 4.0 3.6 4.0 3.4*  CL 100 102 101 101 94*  CO2 37* 34* 32 38* 38*  GLUCOSE 152* 85 52* 158* 159*  BUN 30* 33* 26* 19 13  CREATININE 1.48* 1.55* 1.33* 1.13* 0.94  CALCIUM 8.1* 8.0* 8.3*  8.7* 9.6  MG 2.6* 2.7* 2.7* 2.4 1.9   Liver Function Tests: Recent Labs  Lab 06/06/18 1952 06/07/18 0916  AST 17 16  ALT 15 14  ALKPHOS 28*  26*  BILITOT 0.9 0.8  PROT 5.9* 6.2*  ALBUMIN 3.2* 3.3*   No results for input(s): LIPASE, AMYLASE in the last 168 hours. No results for input(s): AMMONIA in the last 168 hours. CBC: Recent Labs  Lab 06/08/18 0357 06/09/18 0351 06/10/18 0532 06/11/18 0445 06/13/18 0422  WBC 5.1 6.3 5.2 4.6 5.2  NEUTROABS 3.3 3.7 2.4 2.6 2.6  HGB 10.3* 10.3* 10.3* 11.6* 12.3  HCT 37.2 35.5* 34.3* 39.6 39.7  MCV 95.4 91.7 89.1 90.0 88.0  PLT 160 166 167 172 200   Cardiac Enzymes: No results for input(s): CKTOTAL, CKMB, CKMBINDEX, TROPONINI in the last 168 hours. BNP: Invalid input(s): POCBNP CBG: Recent Labs  Lab 06/12/18 0818 06/12/18 1209 06/12/18 1649 06/12/18 2128 06/13/18 0748  GLUCAP 126* 145* 191* 137* 139*   D-Dimer No results for input(s): DDIMER in the last 72 hours. Hgb A1c No results for input(s): HGBA1C in the last 72 hours. Lipid Profile No results for input(s): CHOL, HDL, LDLCALC, TRIG, CHOLHDL, LDLDIRECT in the last 72 hours. Thyroid function studies No results for input(s): TSH, T4TOTAL, T3FREE, THYROIDAB in the last 72 hours.  Invalid input(s): FREET3 Anemia work up No results for input(s): VITAMINB12, FOLATE, FERRITIN, TIBC, IRON, RETICCTPCT in the last 72 hours. Urinalysis    Component Value Date/Time   COLORURINE YELLOW 06/06/2018 1555   APPEARANCEUR HAZY (A) 06/06/2018 1555   LABSPEC 1.017 06/06/2018 1555   PHURINE 5.0 06/06/2018 1555   GLUCOSEU NEGATIVE 06/06/2018 1555   HGBUR SMALL (A) 06/06/2018 1555   BILIRUBINUR NEGATIVE 06/06/2018 1555   KETONESUR NEGATIVE 06/06/2018 1555   PROTEINUR NEGATIVE 06/06/2018 1555   NITRITE NEGATIVE 06/06/2018 1555   LEUKOCYTESUR MODERATE (A) 06/06/2018 1555   Sepsis Labs Invalid input(s): PROCALCITONIN,  WBC,  LACTICIDVEN Microbiology Recent Results (from the past  240 hour(s))  Culture, blood (Routine X 2) w Reflex to ID Panel     Status: None   Collection Time: 06/06/18  8:15 PM  Result Value Ref Range Status   Specimen Description BLOOD LEFT HAND  Final   Special Requests   Final    BOTTLES DRAWN AEROBIC AND ANAEROBIC Blood Culture adequate volume   Culture   Final    NO GROWTH 5 DAYS Performed at [facility], [address] [place], [place] [address]    Report Status 06/11/2018 FINAL  Final  Culture, blood (Routine X 2) w Reflex to ID Panel     Status: None   Collection Time: 06/06/18  8:20 PM  Result Value Ref Range Status   Specimen Description BLOOD SITE NOT SPECIFIED  Final   Special Requests   Final    BOTTLES DRAWN AEROBIC AND ANAEROBIC Blood Culture adequate volume   Culture   Final    NO GROWTH 5 DAYS Performed at [facility], [address], [place], [place] [address]    Report Status 06/11/2018 FINAL  Final  Urine Culture     Status: Abnormal   Collection Time: 06/07/18  3:27 PM  Result Value Ref Range Status   Specimen Description URINE, CLEAN CATCH  Final   Special Requests   Final    NONE Performed at [facility], [address], [place], [place] [address]    Culture MULTIPLE SPECIES PRESENT, SUGGEST RECOLLECTION (A)  Final   Report Status 06/08/2018 FINAL  Final  MRSA PCR Screening     Status: None   Collection Time: 06/09/18  1:52 AM  Result Value Ref Range  Status   MRSA by PCR NEGATIVE NEGATIVE Final    Comment:        The GeneXpert MRSA Assay (FDA approved for NASAL specimens only), is one component of a comprehensive MRSA colonization surveillance program. It is not intended to diagnose MRSA infection nor to guide or monitor treatment for MRSA infections. Performed at [facility], [address], [place], [place] [address]   Culture, body fluid-bottle     Status: None (Preliminary result)   Collection Time: 06/10/18  2:18 PM  Result Value Ref Range Status   Specimen  Description PLEURAL RIGHT  Final   Special Requests NONE  Final   Culture   Final    NO GROWTH 2 DAYS Performed at [facility], [address], [place], [place] [address]    Report Status PENDING  Incomplete  Gram stain     Status: None   Collection Time: 06/10/18  2:18 PM  Result Value Ref Range Status   Specimen Description PLEURAL RIGHT  Final   Special Requests NONE  Final   Gram Stain   Final    ABUNDANT WBC PRESENT, PREDOMINANTLY MONONUCLEAR NO ORGANISMS SEEN Performed at [facility], [address] [place], [place] [address]    Report Status 06/10/2018 FINAL  Final     Time coordinating discharge: 35 minutes  SIGNED:   Glade Lloyd, MD  [facility] 06/13/2018, 9:36 AM Pager: [phone]  If 7PM-7AM, please contact night-coverage [email or web address] Password [email or web address]

## 2018-06-13 NOTE — Progress Notes (Signed)
Nsg Discharge Note  Admit Date:  06/06/2018 Discharge date: 06/13/2018   Malva Limes to be D/C'd Skilled nursing facility per MD order.  AVS completed.  Copy for chart, and copy for facility Patient/caregiver able to verbalize understanding.  Discharge Medication: Allergies as of 06/13/2018      Reactions   Ampicillin Shortness Of Breath   Reaction:  Unknown  Has patient had a PCN reaction causing immediate rash, facial/tongue/throat swelling, SOB or lightheadedness with hypotension:  Unsure Has patient had a PCN reaction causing severe rash involving mucus membranes or skin necrosis: Unsure Has patient had a PCN reaction that required hospitalization Unsure Has patient had a PCN reaction occurring within the last 10 years: Unsure If all of the above answers are "NO", then may proceed with Cephalosporin use.      Medication List    STOP taking these medications   amLODipine 10 MG tablet Commonly known as:  NORVASC   cloNIDine 0.1 MG tablet Commonly known as:  CATAPRES   DULoxetine 20 MG capsule Commonly known as:  CYMBALTA   gabapentin 300 MG capsule Commonly known as:  NEURONTIN   levofloxacin 500 MG tablet Commonly known as:  LEVAQUIN   levofloxacin 750 MG/150ML Soln Commonly known as:  LEVAQUIN   lisinopril 10 MG tablet Commonly known as:  PRINIVIL,ZESTRIL   metoprolol succinate 50 MG 24 hr tablet Commonly known as:  TOPROL-XL   metronidazole 500-0.74 MG/100ML-% Commonly known as:  FLAGYL   pregabalin 50 MG capsule Commonly known as:  LYRICA   QUEtiapine 25 MG tablet Commonly known as:  SEROQUEL     TAKE these medications   acetaminophen 650 MG CR tablet Commonly known as:  TYLENOL Take 650 mg by mouth every 12 (twelve) hours.   aspirin EC 81 MG tablet Take 81 mg by mouth daily.   atorvastatin 10 MG tablet Commonly known as:  LIPITOR Take 10 mg by mouth at bedtime.   calcium carbonate 500 MG chewable tablet Commonly known as:  TUMS - dosed in mg  elemental calcium Chew 2 tablets by mouth 4 (four) times daily as needed for indigestion or heartburn.   cholecalciferol 1000 units tablet Commonly known as:  VITAMIN D Take 2,000 Units by mouth daily.   ferrous sulfate 325 (65 FE) MG tablet Take 325 mg by mouth daily with breakfast.   furosemide 20 MG tablet Commonly known as:  LASIX Take 2 tablets (40 mg total) by mouth 2 (two) times daily. What changed:    how much to take  when to take this   insulin glargine 100 UNIT/ML injection Commonly known as:  LANTUS Inject 0.15 mLs (15 Units total) into the skin 2 (two) times daily. What changed:    how much to take  when to take this   ipratropium-albuterol 0.5-2.5 (3) MG/3ML Soln Commonly known as:  DUONEB Take 3 mLs by nebulization every 6 (six) hours as needed (for wheezing).   magnesium gluconate 500 MG tablet Commonly known as:  MAGONATE Take 500 mg by mouth 3 (three) times daily.   metFORMIN 1000 MG tablet Commonly known as:  GLUCOPHAGE Take 1,000 mg by mouth 2 (two) times daily with a meal.   nitroGLYCERIN 0.4 MG SL tablet Commonly known as:  NITROSTAT Place 0.4 mg under the tongue every 5 (five) minutes as needed for chest pain.   omeprazole 20 MG capsule Commonly known as:  PRILOSEC Take 20 mg by mouth daily before breakfast.   PREVIDENT 5000 BOOSTER 1.1 %  Pste Generic drug:  Sodium Fluoride Place onto teeth every evening.   REFRESH LIQUIGEL 1 % Gel Generic drug:  Carboxymethylcellulose Sodium Place 1 drop into both eyes 4 (four) times daily.   REFRESH OPTIVE ADVANCED 0.5-1-0.5 % Soln Generic drug:  Carboxymeth-Glycerin-Polysorb Place 1 drop into both eyes 4 (four) times daily.       Discharge Assessment: Vitals:   06/13/18 0407 06/13/18 1327  BP: (!) 148/92 118/78  Pulse: 79 78  Resp: 18 18  Temp: 98.6 F (37 C) 98.1 F (36.7 C)  SpO2: 99% 100%   Skin clean, dry and intact without evidence of skin break down, no evidence of skin tears  noted. IV catheter discontinued intact. Site without signs and symptoms of complications - no redness or edema noted at insertion site, patient denies c/o pain - only slight tenderness at site.  Dressing with slight pressure applied.  D/c Instructions-Education: Discharge instructions given to facility D/c education completed with facility including follow up instructions, medication list, d/c activities limitations if indicated, with other d/c instructions as indicated by MD, all questions fully answered. Patient instructed to return to ED, call 911, or call MD for any changes in condition.  Patient transported via EMS.   Theodore Demark, RN 06/13/2018 1:29 PM

## 2018-06-13 NOTE — Progress Notes (Signed)
Physical Therapy Treatment Patient Details Name: Sara Schmidt MRN: [id number] DOB: [birth year] Today's Date: 06/13/2018    History of Present Illness Patient is an 85 year old admitted from facility with SOB. Patient has PMH to include CVA with left hemiparesis, depression, DM, CHF    PT Comments    Pt performed supine LE strengthening with active assisted ROM.  Pt reports she had pain in her backside and required total assistance +2 to roll and adjust padding under patient.  Pt continues to present with generalized weakness and old deficits on L side from CVA.  Based on presentation she continues to benefit from skilled rehab at SNF to continue progression of strengthening to return to baseline and decrease caregiver burden.    Follow Up Recommendations  SNF     Equipment Recommendations  None recommended by PT    Recommendations for Other Services       Precautions / Restrictions Precautions Precautions: Fall Restrictions Weight Bearing Restrictions: No    Mobility  Bed Mobility Overal bed mobility: Needs Assistance Bed Mobility: Rolling;Supine to Sit;Sit to Supine Rolling: Total assist;+2 for physical assistance         General bed mobility comments: Pt performed rolling to R and L side to adjust bed pads and smoothed pads  Transfers                 General transfer comment: NT, unable/unsafe, mechanical lift required at baseline  Ambulation/Gait             General Gait Details: unable- patient non ambulatory at baseline   Stairs             Wheelchair Mobility    Modified Rankin (Stroke Patients Only)       Balance                                            Cognition Arousal/Alertness: Awake/alert Behavior During Therapy: WFL for tasks assessed/performed Overall Cognitive Status: No family/caregiver present to determine baseline cognitive functioning                                         Exercises General Exercises - Lower Extremity Ankle Circles/Pumps: AROM;Both;10 reps;Supine;Limitations Ankle Circles/Pumps Limitations: limited ROM in LLE Quad Sets: AROM;Both;10 reps;Supine Heel Slides: AAROM;Both;10 reps;Supine Hip ABduction/ADduction: AAROM;Both;10 reps;Supine    General Comments        Pertinent Vitals/Pain Pain Assessment: No/denies pain Faces Pain Scale: No hurt Pain Location: leg, intermittently Pain Descriptors / Indicators: Sharp Pain Intervention(s): Monitored during session    Home Living                      Prior Function            PT Goals (current goals can now be found in the care plan section) Acute Rehab PT Goals Patient Stated Goal: none stated Potential to Achieve Goals: Fair Progress towards PT goals: Progressing toward goals    Frequency    Min 2X/week      PT Plan Current plan remains appropriate    Co-evaluation              AM-PAC PT "6 Clicks" Mobility   Outcome Measure  Help needed turning from your back to  your side while in a flat bed without using bedrails?: Total Help needed moving from lying on your back to sitting on the side of a flat bed without using bedrails?: Total Help needed moving to and from a bed to a chair (including a wheelchair)?: Total Help needed standing up from a chair using your arms (e.g., wheelchair or bedside chair)?: Total Help needed to walk in hospital room?: Total Help needed climbing 3-5 steps with a railing? : Total 6 Click Score: 6    End of Session   Activity Tolerance: Patient tolerated treatment well Patient left: in bed;with bed alarm set;with call bell/phone within reach Nurse Communication: Mobility status PT Visit Diagnosis: Muscle weakness (generalized) (M62.81)     Time: 9758-8325 PT Time Calculation (min) (ACUTE ONLY): 14 min  Charges:  $Therapeutic Exercise: 8-22 mins                     Sara Schmidt, PTA Acute Rehabilitation Services Pager  [phone] Office [phone]     Sara Schmidt Artis Delay 06/13/2018, 12:10 PM

## 2018-06-15 LAB — CULTURE, BODY FLUID W GRAM STAIN -BOTTLE: Culture: NO GROWTH

## 2018-06-15 LAB — CULTURE, BODY FLUID-BOTTLE

## 2019-09-25 DEATH — deceased

## 2019-12-11 IMAGING — DX DG CHEST 1V PORT
1 series · 1 of 1 positions shown · non-contrast
Comparison: Yesterday

CLINICAL DATA: Dyspnea

EXAM:
PORTABLE CHEST 1 VIEW

[chest ap]
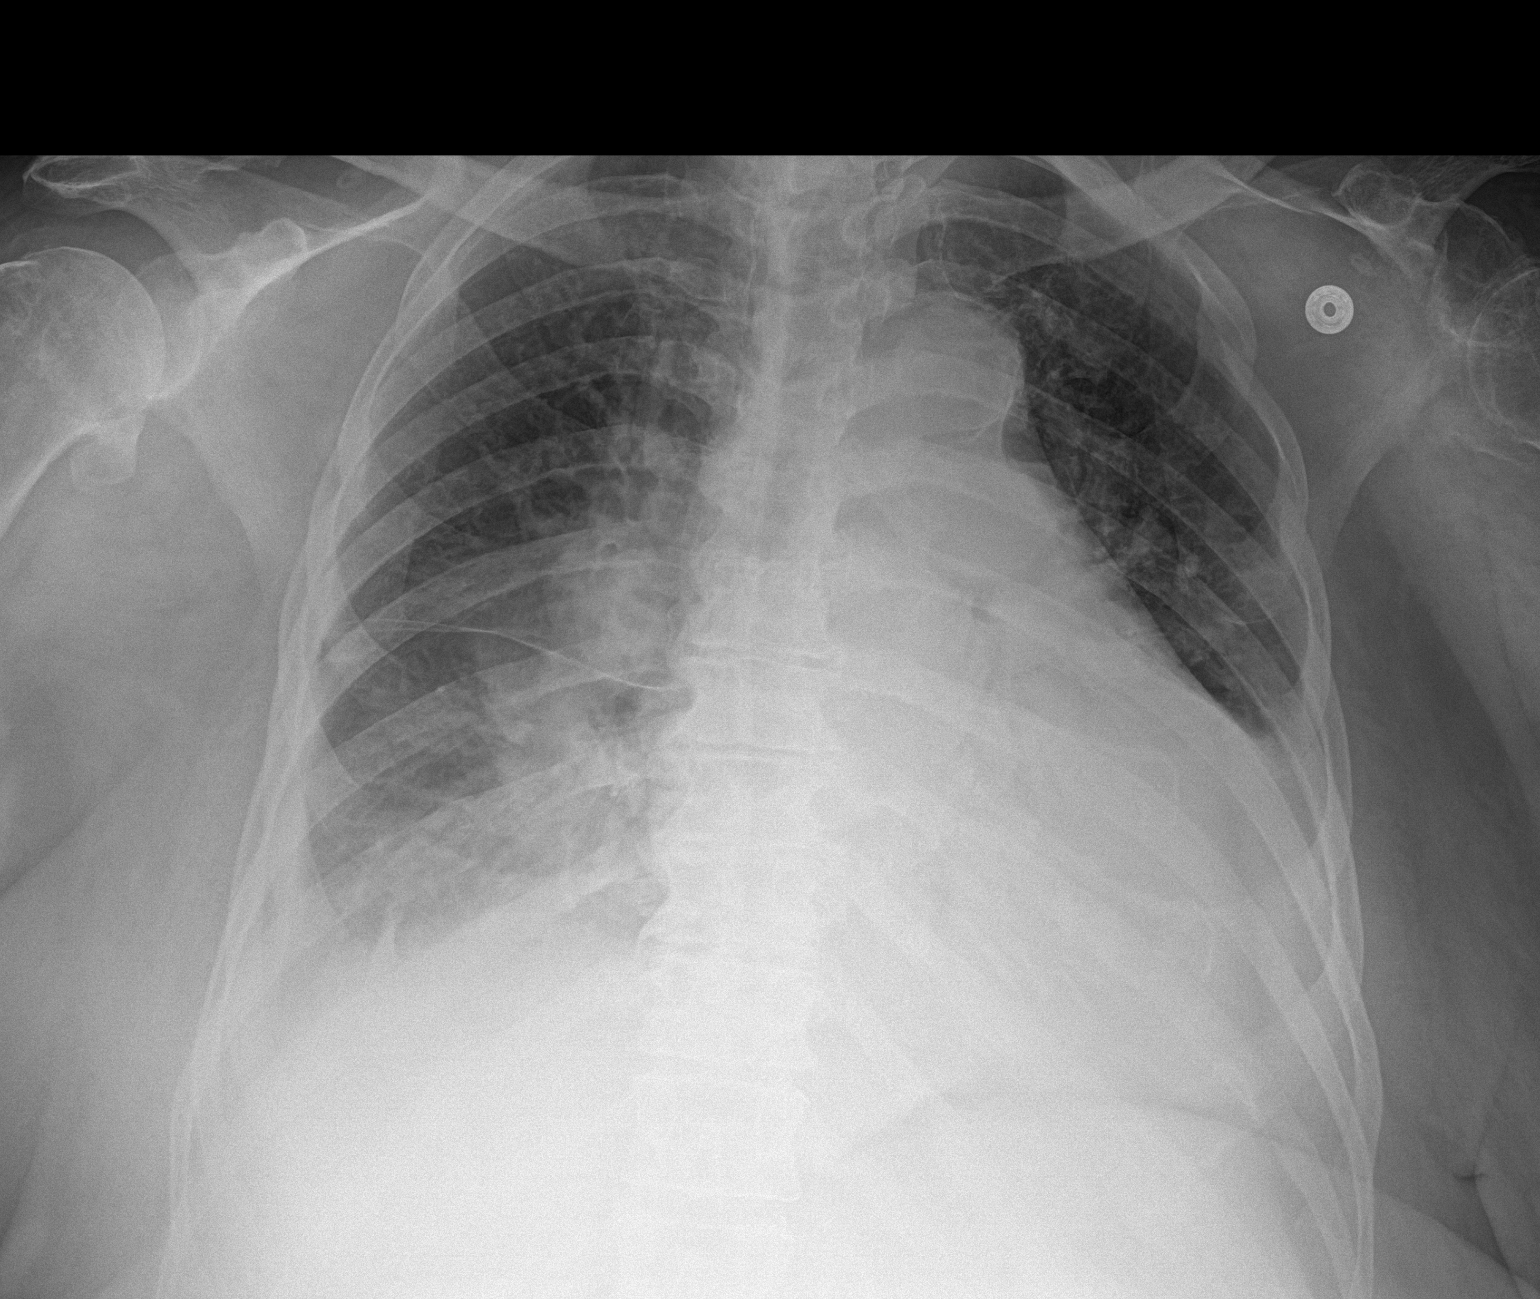

[1 of 1 positions shown; findings below may reference images not displayed]

FINDINGS: Cardiomegaly and vascular congestion. Hazy opacity at the bases from
pleural fluid and airspace disease. No pneumothorax.
IMPRESSION: Unchanged cardiomegaly and bilateral pleural effusion with lower
lobe opacity.

## 2019-12-12 IMAGING — DX DG CHEST 1V
1 series · 1 of 1 positions shown · non-contrast
Comparison: 06/09/2018; 06/08/2018

CLINICAL DATA: Post right-sided thoracentesis

EXAM:
CHEST  1 VIEW

[chest ap]
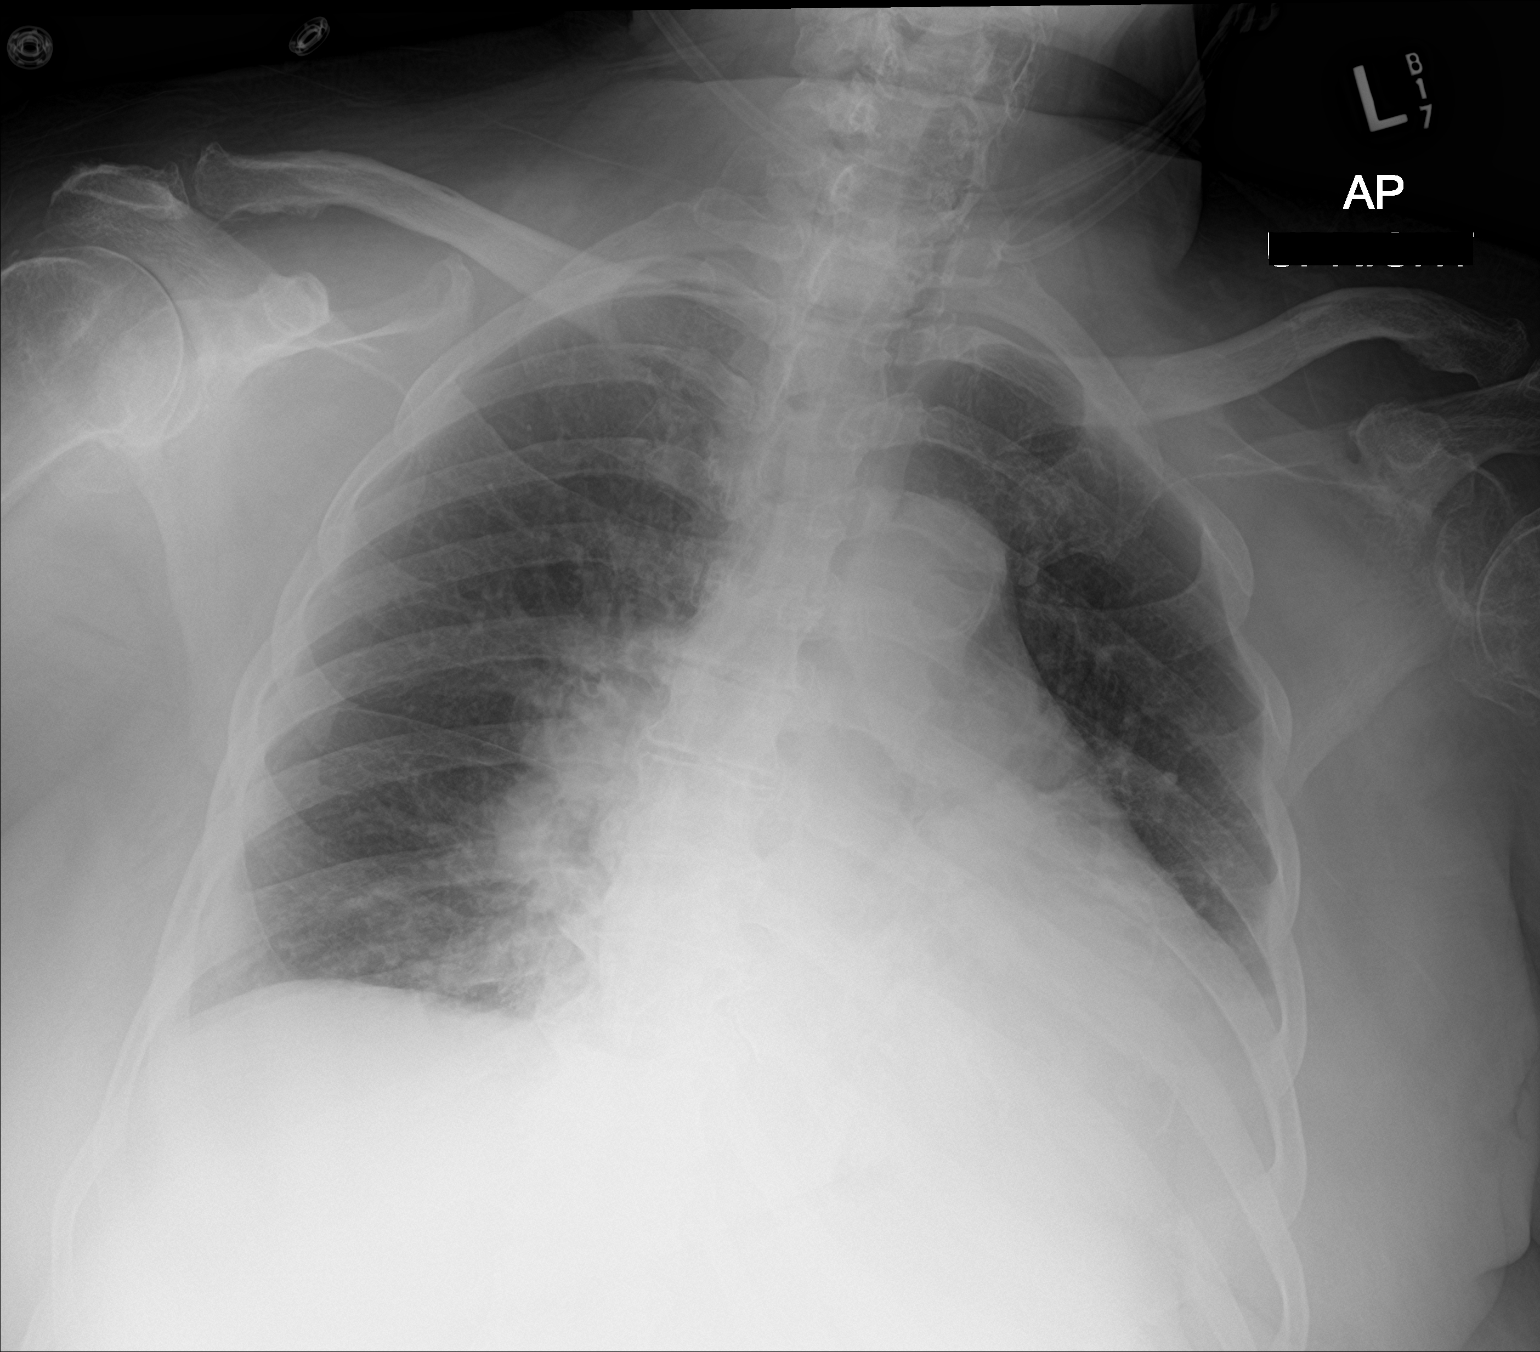

[1 of 1 positions shown; findings below may reference images not displayed]

FINDINGS: Interval reduction/resolution of right-sided pleural effusion post
thoracentesis. No pneumothorax. Improved aeration the right lung
base.

Grossly unchanged enlarged cardiac silhouette and mediastinal
contours with atherosclerotic plaque when the thoracic aorta.

Unchanged small left-sided effusion with associated left basilar
heterogeneous/consolidative opacities.

Pulmonary vasculature remains indistinct with cephalization of flow.
No acute osseous abnormalities.
IMPRESSION: 1. Interval reduction/resolution of right-sided effusion post
thoracentesis. No pneumothorax.
2. Improved aeration of the right lung base.
3. Similar findings of cardiomegaly, pulmonary edema, small
left-sided effusion associated left basilar opacities, likely
atelectasis.

## 2019-12-15 IMAGING — DX DG CHEST 1V PORT
1 series · 1 of 1 positions shown · non-contrast
Comparison: 06/10/2018

CLINICAL DATA: Dyspnea

EXAM:
PORTABLE CHEST 1 VIEW

[chest]
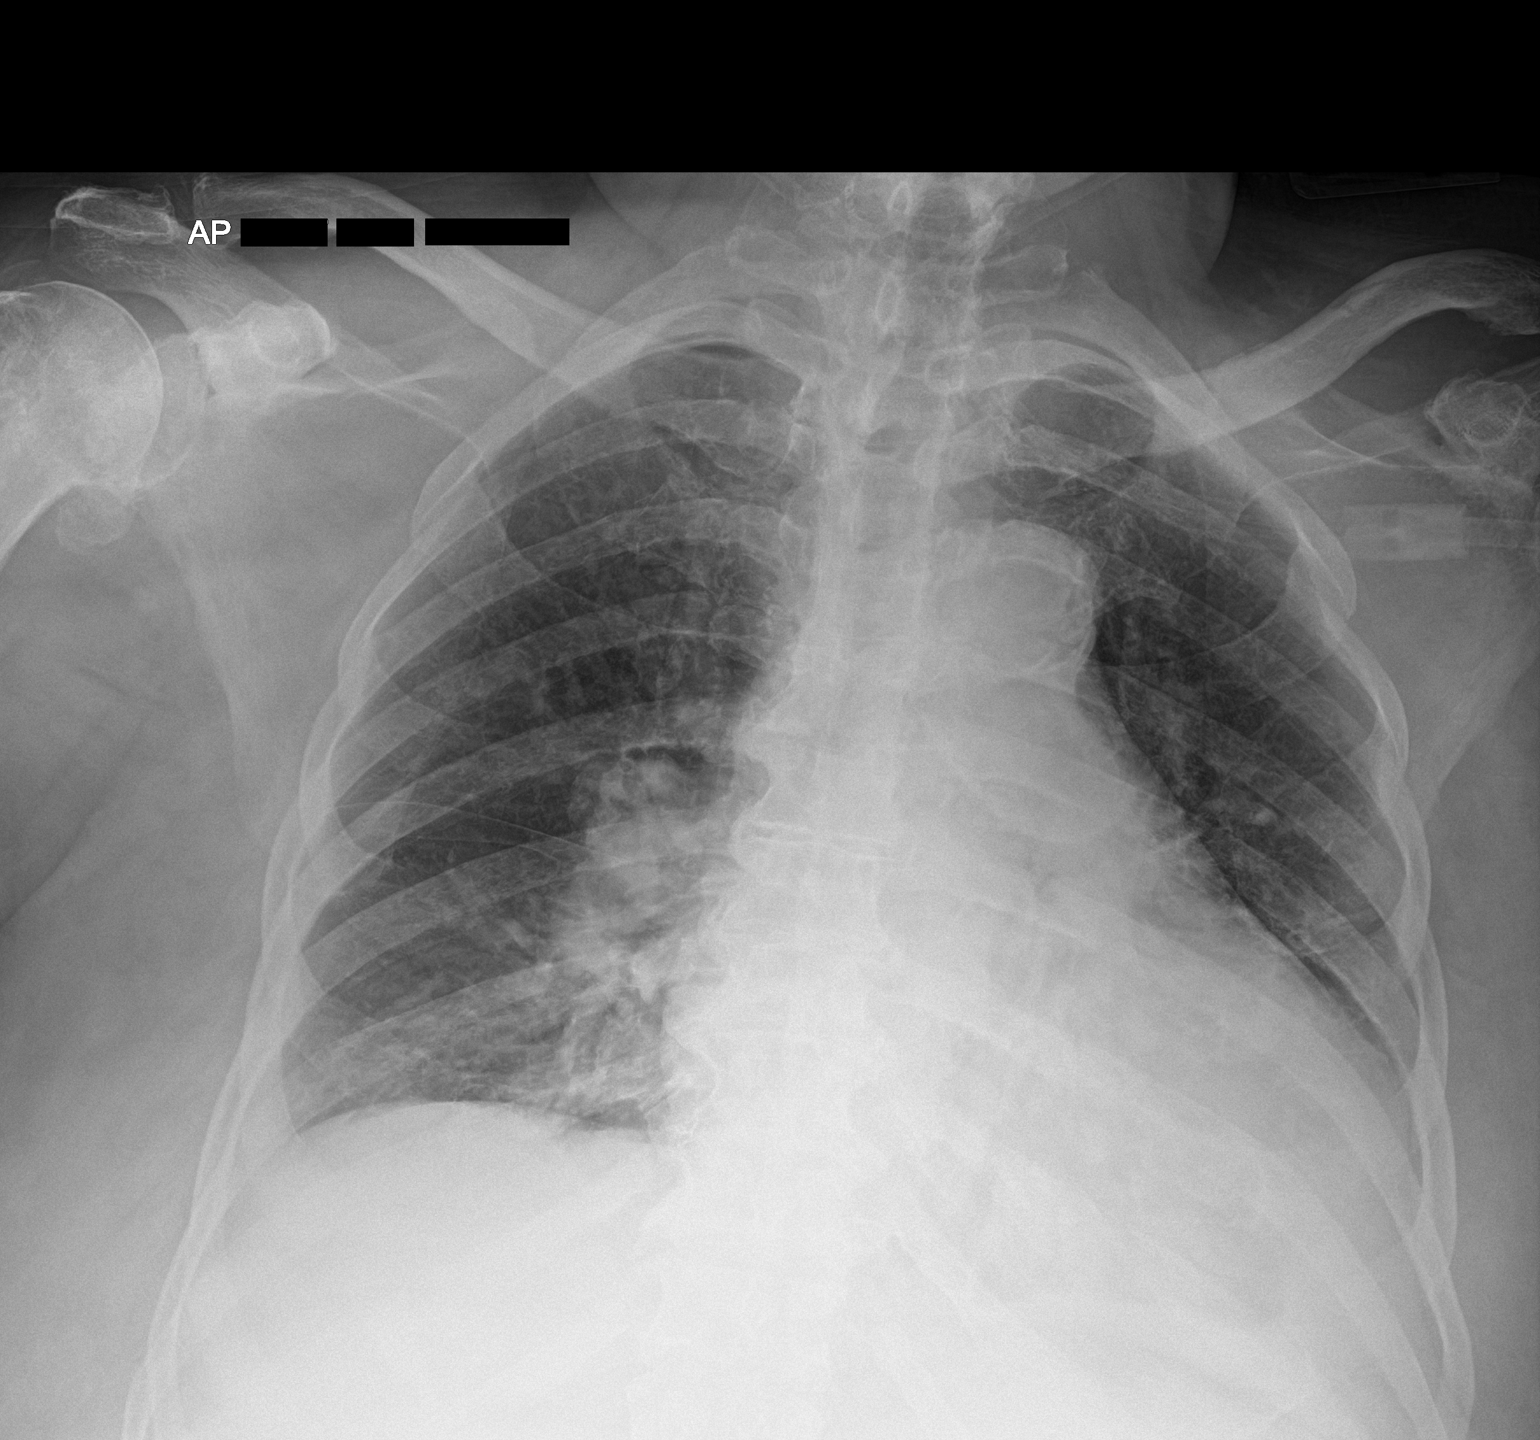

[1 of 1 positions shown; findings below may reference images not displayed]

FINDINGS: Chronic cardiomegaly. Hilar prominence likely related to vascular
congestion. Probable small left pleural effusion. Streaky
retrocardiac density. No generalized Kerley lines. No air
bronchogram or pneumothorax.
IMPRESSION: 1. Cardiomegaly and vascular congestion.
2. Probable atelectasis and small pleural effusion on the left.
